# Patient Record
Sex: Female | Born: 1960 | Race: White | Hispanic: No | Marital: Married | Smoking: Never smoker
Health system: Southern US, Community
[De-identification: ages and names within clinical notes are randomized; demographics above are authoritative.]

## PROBLEM LIST (undated history)

## (undated) DIAGNOSIS — E119 Type 2 diabetes mellitus without complications: Secondary | ICD-10-CM

## (undated) DIAGNOSIS — M1990 Unspecified osteoarthritis, unspecified site: Secondary | ICD-10-CM

## (undated) HISTORY — PX: CHOLECYSTECTOMY: SHX55

## (undated) HISTORY — PX: TONSILLECTOMY: SUR1361

## (undated) HISTORY — PX: OTHER SURGICAL HISTORY: SHX169

## (undated) HISTORY — PX: APPENDECTOMY: SHX54

---
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
## 1979-03-03 HISTORY — PX: TONSILLECTOMY: SUR1361

## 2004-04-20 ENCOUNTER — Emergency Department: Payer: Self-pay | Admitting: Unknown Physician Specialty

## 2004-06-05 ENCOUNTER — Ambulatory Visit: Payer: Self-pay

## 2004-06-19 ENCOUNTER — Ambulatory Visit: Payer: Self-pay

## 2005-01-13 ENCOUNTER — Ambulatory Visit: Payer: Self-pay

## 2006-02-04 ENCOUNTER — Ambulatory Visit: Payer: Self-pay

## 2007-02-15 ENCOUNTER — Ambulatory Visit: Payer: Self-pay

## 2008-02-16 ENCOUNTER — Ambulatory Visit: Payer: Self-pay

## 2008-12-20 ENCOUNTER — Ambulatory Visit: Payer: Self-pay | Admitting: Gastroenterology

## 2009-02-18 ENCOUNTER — Ambulatory Visit: Payer: Self-pay

## 2009-03-02 DIAGNOSIS — T8859XA Other complications of anesthesia, initial encounter: Secondary | ICD-10-CM

## 2009-03-02 HISTORY — DX: Other complications of anesthesia, initial encounter: T88.59XA

## 2009-07-31 HISTORY — PX: OTHER SURGICAL HISTORY: SHX169

## 2009-11-30 HISTORY — PX: CHOLECYSTECTOMY: SHX55

## 2009-12-22 ENCOUNTER — Emergency Department: Payer: Self-pay | Admitting: Emergency Medicine

## 2010-01-06 ENCOUNTER — Inpatient Hospital Stay: Payer: Self-pay | Admitting: Surgery

## 2010-02-26 ENCOUNTER — Ambulatory Visit: Payer: Self-pay

## 2011-03-04 ENCOUNTER — Ambulatory Visit: Payer: Self-pay

## 2012-03-07 ENCOUNTER — Ambulatory Visit: Payer: Self-pay

## 2012-06-21 ENCOUNTER — Emergency Department: Payer: Self-pay | Admitting: Emergency Medicine

## 2013-03-08 ENCOUNTER — Ambulatory Visit: Payer: Self-pay

## 2013-06-07 DIAGNOSIS — E119 Type 2 diabetes mellitus without complications: Secondary | ICD-10-CM | POA: Insufficient documentation

## 2013-06-07 DIAGNOSIS — E785 Hyperlipidemia, unspecified: Secondary | ICD-10-CM | POA: Insufficient documentation

## 2014-02-12 ENCOUNTER — Ambulatory Visit: Payer: Self-pay | Admitting: Gastroenterology

## 2014-03-12 ENCOUNTER — Ambulatory Visit: Payer: Self-pay

## 2014-11-03 DIAGNOSIS — R1011 Right upper quadrant pain: Secondary | ICD-10-CM | POA: Insufficient documentation

## 2014-11-03 DIAGNOSIS — Z9884 Bariatric surgery status: Secondary | ICD-10-CM | POA: Insufficient documentation

## 2015-02-19 ENCOUNTER — Other Ambulatory Visit: Payer: Self-pay | Admitting: Obstetrics and Gynecology

## 2015-02-19 DIAGNOSIS — Z1231 Encounter for screening mammogram for malignant neoplasm of breast: Secondary | ICD-10-CM

## 2015-02-28 ENCOUNTER — Emergency Department
Admission: EM | Admit: 2015-02-28 | Discharge: 2015-02-28 | Disposition: A | Discharge status: Home / Self Care | Payer: BC Managed Care – PPO | Attending: Emergency Medicine | Admitting: Emergency Medicine

## 2015-02-28 DIAGNOSIS — Z3202 Encounter for pregnancy test, result negative: Secondary | ICD-10-CM | POA: Diagnosis not present

## 2015-02-28 DIAGNOSIS — E119 Type 2 diabetes mellitus without complications: Secondary | ICD-10-CM | POA: Diagnosis not present

## 2015-02-28 DIAGNOSIS — R1013 Epigastric pain: Secondary | ICD-10-CM | POA: Insufficient documentation

## 2015-02-28 HISTORY — DX: Type 2 diabetes mellitus without complications: E11.9

## 2015-02-28 LAB — COMPREHENSIVE METABOLIC PANEL
ALBUMIN: 4.2 g/dL (ref 3.5–5.0)
ALT: 42 U/L (ref 14–54)
ANION GAP: 7 (ref 5–15)
AST: 26 U/L (ref 15–41)
Alkaline Phosphatase: 132 U/L — ABNORMAL HIGH (ref 38–126)
BUN: 21 mg/dL — ABNORMAL HIGH (ref 6–20)
CHLORIDE: 102 mmol/L (ref 101–111)
CO2: 28 mmol/L (ref 22–32)
Calcium: 9.3 mg/dL (ref 8.9–10.3)
Creatinine, Ser: 0.62 mg/dL (ref 0.44–1.00)
GFR calc non Af Amer: >60 mL/min (ref >60)
GLUCOSE: 178 mg/dL — AB (ref 65–99)
POTASSIUM: 4.3 mmol/L (ref 3.5–5.1)
SODIUM: 137 mmol/L (ref 135–145)
Total Bilirubin: 0.7 mg/dL (ref 0.3–1.2)
Total Protein: 7.5 g/dL (ref 6.5–8.1)

## 2015-02-28 LAB — URINALYSIS COMPLETE WITH MICROSCOPIC (ARMC ONLY)
BACTERIA UA: NONE SEEN
Bilirubin Urine: NEGATIVE
Glucose, UA: NEGATIVE mg/dL
Hgb urine dipstick: NEGATIVE
Leukocytes, UA: NEGATIVE
Nitrite: NEGATIVE
PH: 5 (ref 5.0–8.0)
PROTEIN: NEGATIVE mg/dL
RBC / HPF: NONE SEEN RBC/hpf (ref 0–5)
Specific Gravity, Urine: 1.024 (ref 1.005–1.030)

## 2015-02-28 LAB — CBC
HEMATOCRIT: 43.2 % (ref 35.0–47.0)
HEMOGLOBIN: 14.4 g/dL (ref 12.0–16.0)
MCH: 28.3 pg (ref 26.0–34.0)
MCHC: 33.4 g/dL (ref 32.0–36.0)
MCV: 84.8 fL (ref 80.0–100.0)
Platelets: 253 10*3/uL (ref 150–440)
RBC: 5.1 MIL/uL (ref 3.80–5.20)
RDW: 12.8 % (ref 11.5–14.5)
WBC: 10 10*3/uL (ref 3.6–11.0)

## 2015-02-28 LAB — LIPASE, BLOOD: LIPASE: 48 U/L (ref 11–51)

## 2015-02-28 LAB — POCT PREGNANCY, URINE: PREG TEST UR: NEGATIVE

## 2015-02-28 MED ORDER — DICYCLOMINE HCL 10 MG/ML IM SOLN
20.0000 mg | Freq: Once | INTRAMUSCULAR | Status: AC
  Administered 2015-02-28: 20 mg via INTRAMUSCULAR
  Filled 2015-02-28: qty 2

## 2015-02-28 MED ORDER — GI COCKTAIL ~~LOC~~
30.0000 mL | Freq: Once | ORAL | Status: AC
Start: 2015-02-28 — End: 2015-02-28
  Administered 2015-02-28: 30 mL via ORAL
  Filled 2015-02-28: qty 30

## 2015-02-28 MED ORDER — DICYCLOMINE HCL 20 MG PO TABS: 20.0000 mg | ORAL_TABLET | Freq: Three times a day (TID) | ORAL | Status: AC | PRN

## 2015-02-28 NOTE — Discharge Instructions (Signed)
Please seek medical attention for any high fevers, chest pain, shortness of breath, change in behavior, persistent vomiting, bloody stool or any other new or concerning symptoms. ° ° °Abdominal Pain, Adult °Many things can cause abdominal pain. Usually, abdominal pain is not caused by a disease and will improve without treatment. It can often be observed and treated at home. Your health care provider will do a physical exam and possibly order blood tests and X-rays to help determine the seriousness of your pain. However, in many cases, more time must pass before a clear cause of the pain can be found. Before that point, your health care provider may not know if you need more testing or further treatment. °HOME CARE INSTRUCTIONS °Monitor your abdominal pain for any changes. The following actions may help to alleviate any discomfort you are experiencing: °· Only take over-the-counter or prescription medicines as directed by your health care provider. °· Do not take laxatives unless directed to do so by your health care provider. °· Try a clear liquid diet (broth, tea, or water) as directed by your health care provider. Slowly move to a bland diet as tolerated. °SEEK MEDICAL CARE IF: °· You have unexplained abdominal pain. °· You have abdominal pain associated with nausea or diarrhea. °· You have pain when you urinate or have a bowel movement. °· You experience abdominal pain that wakes you in the night. °· You have abdominal pain that is worsened or improved by eating food. °· You have abdominal pain that is worsened with eating fatty foods. °· You have a fever. °SEEK IMMEDIATE MEDICAL CARE IF: °· Your pain does not go away within 2 hours. °· You keep throwing up (vomiting). °· Your pain is felt only in portions of the abdomen, such as the right side or the left lower portion of the abdomen. °· You pass bloody or black tarry stools. °MAKE SURE YOU: °· Understand these instructions. °· Will watch your  condition. °· Will get help right away if you are not doing well or get worse. °  °This information is not intended to replace advice given to you by your health care provider. Make sure you discuss any questions you have with your health care provider. °  °Document Released: 11/26/2004 Document Revised: 11/07/2014 Document Reviewed: 10/26/2012 °Elsevier Interactive Patient Education ©2016 Elsevier Inc. ° °

## 2015-02-28 NOTE — ED Notes (Signed)
Pt c/o sharp epigastric pain since 10am with nausea.. Denies vomiting or diarrhea

## 2015-02-28 NOTE — ED Provider Notes (Signed)
[facility] Emergency Department Provider Note  ____________________________________________  Time seen: 1710  I have reviewed the triage vital signs and the nursing notes.   HISTORY  Chief Complaint Abdominal Pain   History limited by: Not Limited   HPI Maureen Cruz is a 54 y.o. female who presents to the emergency department today because of concerns for epigastric pain. She states it started suddenly about 8 hours ago. It is sharp. It is severe. It is located in the epigastric region. She has not been able to change the intensity of the pain. She states she has had similar pain a couple of times in the past. She will underwent an EGD recently for this pain which she states showed some esophageal lesions. She denies eating anything unusual recently. No significant recent alcohol or NSAID use. No fevers. No chest pain or shortness breath.   Past Medical History  Diagnosis Date  . Diabetes mellitus without complication (HCC)     There are no active problems to display for this patient.   Past Surgical History  Procedure Laterality Date  . Appendectomy    . Cholecystectomy    . Tonsillectomy    . Cesarean section    . Gastic band      No current outpatient prescriptions on file.  Allergies Metformin and related  No family history on file.  Social History Social History  Substance Use Topics  . Smoking status: Never Smoker   . Smokeless tobacco: None  . Alcohol Use: No    Review of Systems  Constitutional: Negative for fever. Cardiovascular: Negative for chest pain. Respiratory: Negative for shortness of breath. Gastrointestinal: Positive for epigastric abdominal pain. Negative for vomiting or diarrhea. Neurological: Negative for headaches, focal weakness or numbness.  10-point ROS otherwise negative.  ____________________________________________   PHYSICAL EXAM:  VITAL SIGNS: ED Triage Vitals  Enc Vitals Group     BP  02/28/15 1551 125/49 mmHg     Pulse Rate 02/28/15 1551 86     Resp 02/28/15 1551 18     Temp 02/28/15 1551 98.1 F (36.7 C)     Temp Source 02/28/15 1551 Oral     SpO2 02/28/15 1551 98 %     Weight 02/28/15 1551 200 lb (90.719 kg)     Height 02/28/15 1551  (1.575 m)     Head Cir --      Peak Flow --      Pain Score 02/28/15 1551 8   Constitutional: Alert and oriented. Well appearing and in no distress. Eyes: Conjunctivae are normal. PERRL. Normal extraocular movements. ENT   Head: Normocephalic and atraumatic.   Nose: No congestion/rhinnorhea.   Mouth/Throat: Mucous membranes are moist.   Neck: No stridor. Hematological/Lymphatic/Immunilogical: No cervical lymphadenopathy. Cardiovascular: Normal rate, regular rhythm.  No murmurs, rubs, or gallops. Respiratory: Normal respiratory effort without tachypnea nor retractions. Breath sounds are clear and equal bilaterally. No wheezes/rales/rhonchi. Gastrointestinal: Soft and nontender. No distention. There is no CVA tenderness. Genitourinary: Deferred Musculoskeletal: Normal range of motion in all extremities. No joint effusions.  No lower extremity tenderness nor edema. Neurologic:  Normal speech and language. No gross focal neurologic deficits are appreciated.  Skin:  Skin is warm, dry and intact. No rash noted. Psychiatric: Mood and affect are normal. Speech and behavior are normal. Patient exhibits appropriate insight and judgment.  ____________________________________________    LABS (pertinent positives/negatives)  Labs Reviewed  COMPREHENSIVE METABOLIC PANEL - Abnormal; Notable for the following:    Glucose,  Bld 178 (*)    BUN 21 (*)    Alkaline Phosphatase 132 (*)    All other components within normal limits  URINALYSIS COMPLETEWITH MICROSCOPIC ([facility] ONLY) - Abnormal; Notable for the following:    Color, Urine YELLOW (*)    APPearance CLEAR (*)    Ketones, ur TRACE (*)    Squamous Epithelial / LPF 0-5  (*)    All other components within normal limits  LIPASE, BLOOD  CBC  POCT PREGNANCY, URINE     ____________________________________________   EKG  None  ____________________________________________    RADIOLOGY  None   ____________________________________________   PROCEDURES  Procedure(s) performed: None  Critical Care performed: No  ____________________________________________   INITIAL IMPRESSION / ASSESSMENT AND PLAN / ED COURSE  Pertinent labs & imaging results that were available during my care of the patient were reviewed by me and considered in my medical decision making (see chart for details).  Patient presented to the emergency department today because of concerns for epigastric pain. On exam patient's abdomen was benign. She did state she felt better after Bentyl. Blood work without any concerning findings. Patient also recently had EGD done to evaluate for the epigastric pain. This point I think the patient's pain is improved and benign exam she is safe for discharge home.  ____________________________________________   FINAL CLINICAL IMPRESSION(S) / ED DIAGNOSES  Final diagnoses:  Epigastric pain     Phineas SemenGraydon Nickolaus Bordelon, MD 02/28/15 [phone]

## 2015-03-14 ENCOUNTER — Ambulatory Visit
Admission: RE | Admit: 2015-03-14 | Discharge: 2015-03-14 | Disposition: A | Discharge status: Home / Self Care | Payer: BC Managed Care – PPO | Source: Ambulatory Visit | Attending: Obstetrics and Gynecology | Admitting: Obstetrics and Gynecology

## 2015-03-14 DIAGNOSIS — Z1231 Encounter for screening mammogram for malignant neoplasm of breast: Secondary | ICD-10-CM | POA: Diagnosis not present

## 2015-03-21 DIAGNOSIS — K2100 Gastro-esophageal reflux disease with esophagitis, without bleeding: Secondary | ICD-10-CM | POA: Insufficient documentation

## 2015-09-27 DIAGNOSIS — K9509 Other complications of gastric band procedure: Secondary | ICD-10-CM | POA: Insufficient documentation

## 2016-03-24 DIAGNOSIS — N9419 Other specified dyspareunia: Secondary | ICD-10-CM | POA: Insufficient documentation

## 2016-03-27 ENCOUNTER — Other Ambulatory Visit: Payer: Self-pay | Admitting: Obstetrics and Gynecology

## 2016-03-27 DIAGNOSIS — Z1231 Encounter for screening mammogram for malignant neoplasm of breast: Secondary | ICD-10-CM

## 2016-04-28 ENCOUNTER — Ambulatory Visit
Admission: RE | Admit: 2016-04-28 | Discharge: 2016-04-28 | Disposition: A | Discharge status: Home / Self Care | Payer: BC Managed Care – PPO | Source: Ambulatory Visit | Attending: Obstetrics and Gynecology | Admitting: Obstetrics and Gynecology

## 2016-04-28 DIAGNOSIS — Z1231 Encounter for screening mammogram for malignant neoplasm of breast: Secondary | ICD-10-CM | POA: Insufficient documentation

## 2017-03-26 ENCOUNTER — Other Ambulatory Visit: Payer: Self-pay | Admitting: Obstetrics and Gynecology

## 2017-03-26 DIAGNOSIS — Z1231 Encounter for screening mammogram for malignant neoplasm of breast: Secondary | ICD-10-CM

## 2017-04-29 ENCOUNTER — Ambulatory Visit
Admission: RE | Admit: 2017-04-29 | Discharge: 2017-04-29 | Disposition: A | Discharge status: Home / Self Care | Payer: BC Managed Care – PPO | Source: Ambulatory Visit | Attending: Obstetrics and Gynecology | Admitting: Obstetrics and Gynecology

## 2017-04-29 DIAGNOSIS — Z1231 Encounter for screening mammogram for malignant neoplasm of breast: Secondary | ICD-10-CM | POA: Insufficient documentation

## 2018-04-08 ENCOUNTER — Other Ambulatory Visit: Payer: Self-pay | Admitting: Internal Medicine

## 2018-04-08 DIAGNOSIS — Z1231 Encounter for screening mammogram for malignant neoplasm of breast: Secondary | ICD-10-CM

## 2018-05-05 ENCOUNTER — Ambulatory Visit
Admission: RE | Admit: 2018-05-05 | Discharge: 2018-05-05 | Disposition: A | Discharge status: Home / Self Care | Payer: BC Managed Care – PPO | Source: Ambulatory Visit | Attending: Internal Medicine | Admitting: Internal Medicine

## 2018-05-05 DIAGNOSIS — Z1231 Encounter for screening mammogram for malignant neoplasm of breast: Secondary | ICD-10-CM | POA: Insufficient documentation

## 2019-05-01 ENCOUNTER — Other Ambulatory Visit: Payer: Self-pay | Admitting: Infectious Diseases

## 2019-05-01 ENCOUNTER — Other Ambulatory Visit: Payer: Self-pay | Admitting: Internal Medicine

## 2019-05-01 DIAGNOSIS — Z1231 Encounter for screening mammogram for malignant neoplasm of breast: Secondary | ICD-10-CM

## 2019-07-11 ENCOUNTER — Ambulatory Visit
Admission: RE | Admit: 2019-07-11 | Discharge: 2019-07-11 | Disposition: A | Discharge status: Home / Self Care | Payer: BC Managed Care – PPO | Source: Ambulatory Visit | Attending: Infectious Diseases | Admitting: Infectious Diseases

## 2019-07-11 DIAGNOSIS — Z1231 Encounter for screening mammogram for malignant neoplasm of breast: Secondary | ICD-10-CM | POA: Insufficient documentation

## 2020-06-03 ENCOUNTER — Other Ambulatory Visit: Payer: Self-pay | Admitting: General Surgery

## 2020-06-03 ENCOUNTER — Other Ambulatory Visit: Payer: Self-pay | Admitting: Infectious Diseases

## 2020-06-03 DIAGNOSIS — Z1231 Encounter for screening mammogram for malignant neoplasm of breast: Secondary | ICD-10-CM

## 2020-07-11 ENCOUNTER — Ambulatory Visit
Admission: RE | Admit: 2020-07-11 | Discharge: 2020-07-11 | Disposition: A | Discharge status: Home / Self Care | Payer: BC Managed Care – PPO | Source: Ambulatory Visit | Attending: Infectious Diseases | Admitting: Infectious Diseases

## 2020-07-11 ENCOUNTER — Other Ambulatory Visit: Payer: Self-pay

## 2020-07-11 DIAGNOSIS — Z1231 Encounter for screening mammogram for malignant neoplasm of breast: Secondary | ICD-10-CM | POA: Insufficient documentation

## 2020-09-13 DIAGNOSIS — M1990 Unspecified osteoarthritis, unspecified site: Secondary | ICD-10-CM | POA: Insufficient documentation

## 2020-09-13 DIAGNOSIS — H209 Unspecified iridocyclitis: Secondary | ICD-10-CM | POA: Insufficient documentation

## 2021-07-21 ENCOUNTER — Other Ambulatory Visit: Payer: Self-pay | Admitting: Infectious Diseases

## 2021-07-23 ENCOUNTER — Other Ambulatory Visit: Payer: Self-pay | Admitting: Infectious Diseases

## 2021-07-23 DIAGNOSIS — N6310 Unspecified lump in the right breast, unspecified quadrant: Secondary | ICD-10-CM

## 2021-07-27 DIAGNOSIS — M1711 Unilateral primary osteoarthritis, right knee: Secondary | ICD-10-CM | POA: Insufficient documentation

## 2021-08-11 ENCOUNTER — Ambulatory Visit
Admission: RE | Admit: 2021-08-11 | Discharge: 2021-08-11 | Disposition: A | Discharge status: Home / Self Care | Payer: BC Managed Care – PPO | Source: Ambulatory Visit | Attending: Infectious Diseases | Admitting: Infectious Diseases

## 2021-08-11 DIAGNOSIS — N6310 Unspecified lump in the right breast, unspecified quadrant: Secondary | ICD-10-CM

## 2021-09-07 NOTE — Discharge Instructions (Signed)
Instructions after Total Knee Replacement   Maureen Cruz P. Nioma Mccubbins, Jr., M.D.     Dept. of Orthopaedics & Sports Medicine  [facility]  [address]  [place], [place]  [address]  Phone: [phone]   Fax: [phone]    DIET: Drink plenty of non-alcoholic fluids. Resume your normal diet. Include foods high in fiber.  ACTIVITY:  You may use crutches or a walker with weight-bearing as tolerated, unless instructed otherwise. You may be weaned off of the walker or crutches by your Physical Therapist.  Do NOT place pillows under the knee. Anything placed under the knee could limit your ability to straighten the knee.   Continue doing gentle exercises. Exercising will reduce the pain and swelling, increase motion, and prevent muscle weakness.   Please continue to use the TED compression stockings for 6 weeks. You may remove the stockings at night, but should reapply them in the morning. Do not drive or operate any equipment until instructed.  WOUND CARE:  Continue to use the PolarCare or ice packs periodically to reduce pain and swelling. You may bathe or shower after the staples are removed at the first office visit following surgery.  MEDICATIONS: You may resume your regular medications. Please take the pain medication as prescribed on the medication. Do not take pain medication on an empty stomach. You have been given a prescription for a blood thinner (Lovenox or Coumadin). Please take the medication as instructed. (NOTE: After completing a 2 week course of Lovenox, take one Enteric-coated aspirin once a day. This along with elevation will help reduce the possibility of phlebitis in your operated leg.) Do not drive or drink alcoholic beverages when taking pain medications.  CALL THE OFFICE FOR: Temperature above 101 degrees Excessive bleeding or drainage on the dressing. Excessive swelling, coldness, or paleness of the toes. Persistent nausea and vomiting.  FOLLOW-UP:  You  should have an appointment to return to the office in 10-14 days after surgery. Arrangements have been made for continuation of Physical Therapy (either home therapy or outpatient therapy).   [facility] Department Directory         [email or web address]       [email or web address]          Cardiology  Appointments: [place] - [phone] [place] - [phone]  Endocrinology  Appointments: [place] - [phone] [place] - [phone]  Gastroenterology  Appointments: [place] - [phone] [place] - [phone]        General Surgery   Appointments: [place] - [phone]  Internal Medicine/Family Medicine  Appointments: [place] - [phone] [place] - [phone] [place] - [phone]  Metabolic and Weigh Loss Surgery  Appointments: [place] - [phone]        Neurology  Appointments: [place] - [phone] [place] - [phone]  Neurosurgery  Appointments: [place] - [phone]  Obstetrics & Gynecology  Appointments: [place] - [phone] [place] - [phone]        Pediatrics  Appointments: [place] - [phone] [place] - [phone]  Physiatry  Appointments: [place] -[phone]  Physical Therapy  Appointments: [place] - [phone] [place] - [phone]        Podiatry  Appointments: [place] - [phone] [place] - [phone]  Pulmonology  Appointments: [place] - [phone]  Rheumatology  Appointments: [place] - [phone]        [place] Location: [facility]  [address] , [place]  [address]  [place] Location: [facility] [address] [place], [place]  [address]  [place] Location: [facility] [address] [place],   [address]    

## 2021-09-16 ENCOUNTER — Encounter
Admission: RE | Admit: 2021-09-16 | Discharge: 2021-09-16 | Disposition: A | Discharge status: Home / Self Care | Payer: BC Managed Care – PPO | Source: Ambulatory Visit | Attending: Orthopedic Surgery | Admitting: Orthopedic Surgery

## 2021-09-16 VITALS — BP 128/63 | HR 65 | Temp 98.1°F | Resp 16 | Ht 60.0 in | Wt 175.2 lb

## 2021-09-16 DIAGNOSIS — Z01818 Encounter for other preprocedural examination: Secondary | ICD-10-CM | POA: Insufficient documentation

## 2021-09-16 DIAGNOSIS — E119 Type 2 diabetes mellitus without complications: Secondary | ICD-10-CM | POA: Diagnosis not present

## 2021-09-16 DIAGNOSIS — Z01812 Encounter for preprocedural laboratory examination: Secondary | ICD-10-CM

## 2021-09-16 DIAGNOSIS — M1711 Unilateral primary osteoarthritis, right knee: Secondary | ICD-10-CM

## 2021-09-16 HISTORY — DX: Unspecified osteoarthritis, unspecified site: M19.90

## 2021-09-16 LAB — URINALYSIS, ROUTINE W REFLEX MICROSCOPIC
Bacteria, UA: NONE SEEN
Bilirubin Urine: NEGATIVE
Glucose, UA: >=500 mg/dL — AB
Hgb urine dipstick: NEGATIVE
Ketones, ur: NEGATIVE mg/dL
Leukocytes,Ua: NEGATIVE
Nitrite: NEGATIVE
Protein, ur: NEGATIVE mg/dL
Specific Gravity, Urine: 1.024 (ref 1.005–1.030)
WBC, UA: NONE SEEN WBC/hpf (ref 0–5)
pH: 5 (ref 5.0–8.0)

## 2021-09-16 LAB — CBC
HCT: 46.9 % — ABNORMAL HIGH (ref 36.0–46.0)
Hemoglobin: 15.3 g/dL — ABNORMAL HIGH (ref 12.0–15.0)
MCH: 28.5 pg (ref 26.0–34.0)
MCHC: 32.6 g/dL (ref 30.0–36.0)
MCV: 87.5 fL (ref 80.0–100.0)
Platelets: 273 10*3/uL (ref 150–400)
RBC: 5.36 MIL/uL — ABNORMAL HIGH (ref 3.87–5.11)
RDW: 12.5 % (ref 11.5–15.5)
WBC: 8.2 10*3/uL (ref 4.0–10.5)
nRBC: 0 % (ref 0.0–0.2)

## 2021-09-16 LAB — SURGICAL PCR SCREEN
MRSA, PCR: NEGATIVE
Staphylococcus aureus: POSITIVE — AB

## 2021-09-16 LAB — SEDIMENTATION RATE: Sed Rate: 6 mm/hr (ref 0–30)

## 2021-09-16 LAB — C-REACTIVE PROTEIN: CRP: 0.7 mg/dL (ref <1.0)

## 2021-09-16 NOTE — Patient Instructions (Addendum)
Your procedure is scheduled on: Friday September 26, 2021. Report to Day Surgery inside [facility] 2nd floor, stop by admissions desk before getting on elevator.  To find out your arrival time please call [phone] between 1PM - 3PM on Thursday September 25, 2021.  Remember: Instructions that are not followed completely may result in serious medical risk,  up to and including death, or upon the discretion of your surgeon and anesthesiologist your  surgery may need to be rescheduled.     _X__ 1. Do not eat food after midnight the night before your procedure.                 No chewing gum or hard candies. You may drink clear liquids up to 2 hours                 before you are scheduled to arrive for your surgery- DO not drink clear                 liquids within 2 hours of the start of your surgery.                 Clear Liquids include:  water, apple juice without pulp, clear Gatorade, G2 or                  Gatorade Zero (avoid Red/Purple/Blue), Black Coffee or Tea (Do not add                 anything to coffee or tea).  __X__2.   Complete the "Ensure Clear Pre-surgery Clear Carbohydrate Drink" provided to you, 2 hours before arrival. **If you are diabetic you will be provided with an alternative drink, Gatorade Zero or G2.  __X__3.  On the morning of surgery brush your teeth with toothpaste and water, you                may rinse your mouth with mouthwash if you wish.  Do not swallow any toothpaste of mouthwash.     _X__ 4.  No Alcohol for 24 hours before or after surgery.   _X__ 5  Do Not Smoke or use e-cigarettes For 24 Hours Prior to Your Surgery.                 Do not use any chewable tobacco products for at least 6 hours prior to                 Surgery.  _X__  6.  Do not use any recreational drugs (marijuana, cocaine, heroin, ecstasy, MDMA or other)                For at least one week prior to your surgery.  Combination of these drugs with anesthesia                 May have life threatening results.  ____ 7 .  Bring all medications with you on the day of surgery if instructed.   __X_ 8.  Notify your doctor if there is any change in your medical condition      (cold, fever, infections).     Do not wear jewelry, make-up, hairpins, clips or nail polish. Do not wear lotions, powders, or perfumes. You may wear deodorant. Do not shave 48 hours prior to surgery. Men may shave face and neck. Do not bring valuables to the hospital.    [facility] is not responsible for any belongings or valuables.  Contacts, dentures or bridgework may not be worn into surgery. Leave your suitcase in the car. After surgery it may be brought to your room. For patients admitted to the hospital, discharge time is determined by your treatment team.   Patients discharged the day of surgery will not be allowed to drive home.   Make arrangements for someone to be with you for the first 24 hours of your Same Day Discharge.   __X__ Take these medicines the morning of surgery with A SIP OF WATER:    1. None   2.   3.   4.  5.  6.  ____ Fleet Enema (as directed)   __X__ Use CHG Soap (or wipes) as directed  ____ Use Benzoyl Peroxide Gel as instructed  ____ Use inhalers on the day of surgery  __X__ Stop empagliflozin (JARDIANCE) 25 MG 3 days prior to surgery (take last dose Monday 09/22/21)     ____ Take 1/2 of usual insulin dose the night before surgery. No insulin the morning          of surgery.   __X__ One Week prior to surgery- Stop Anti-inflammatories such as Ibuprofen, Aleve, Advil, Motrin, meloxicam (MOBIC), diclofenac, etodolac, ketorolac, Toradol, Daypro, piroxicam, Goody's or BC powders ans aspirin. OK TO USE TYLENOL IF NEEDED   __X__ One week prior to surgery stop ALL supplements until after surgery.    ____ Bring C-Pap to the hospital.    If you have any questions regarding your pre-procedure instructions,  Please call Pre-admit Testing at  [phone]

## 2021-09-21 NOTE — H&P (Signed)
ORTHOPAEDIC HISTORY & PHYSICAL Michelene Gardener, [place] - 09/18/2021 11:00 AM EDT Formatting of this note is different from the original. [facility] Chief Complaint:   Chief Complaint  Patient presents with  Knee Pain  H & P RIGHT KNEE   History of Present Illness:   Maureen Cruz is a 61 y.o. female that presents to clinic today for her preoperative history and evaluation. Patient presents unaccompanied. The patient is scheduled to undergo a right total knee arthroplasty on 09/26/21 by Dr. Ernest Pine. Her pain began 2 years ago. The pain is located primarily along the medial aspect of the knee. She describes her pain as worse with weightbearing, going up and down stairs, and walking. She reports associated swelling with some giving way of the knee. She denies associated numbness or tingling, denies locking.   The patient's symptoms have progressed to the point that they decrease her quality of life. The patient has previously undergone conservative treatment including NSAIDS and injections to the knee without adequate control of her symptoms.  Denies history of lumbar surgery, history of DVT, or significant cardiac history.   Patient is a Type II Diabetic, last A1C was 6.5 on 09/04/21.  Past Medical, Surgical, Family, Social History, Allergies, Medications:   Past Medical History:  Past Medical History:  Diagnosis Date  Acute pancreatitis 09/2009  Allergy  Codeine, Metphormin  Anesthesia complication  had vagal episode  Arthritis  Diabetes mellitus type 2, uncomplicated (CMS-HCC)  Dyspareunia due to medical condition in female patient  Gastroesophageal reflux disease 03/21/2015  GERD (gastroesophageal reflux disease)  History of abnormal cervical Pap smear 1989  History of cataract March/April 2018  Hyperlipidemia  Iridocyclitis  Iritis  Obesity  s/p gastric banding 2011  PONV (postoperative nausea and vomiting)   Past Surgical  History:  Past Surgical History:  Procedure Laterality Date  APPENDECTOMY 1971  TONSILLECTOMY 1980  GYNECOLOGIC CRYOSURGERY 1989  CHOLECYSTECTOMY 2011  pancreatitis with gallbladder removal and additional stone removed  ABDOMINAL SURGERY 2011  LAPAROSCOPIC REMOVAL REPLACEMENT/REVISION GASTRIC BANDING PORT 07/2009  placed in 2011 - has not had removed  BARIATRIC SURGERY June 2011  COLONOSCOPY 02/12/2014  Entire examined colon is normal/Repeat 93yrs for FHx of CC-Mother/PYO  EGD N/A 02/11/2015  Procedure: ESOPHAGOGASTRODUODENOSCOPY, FLEXIBLE, TRANSORAL; DIAGNOSTIC, INCLUDING COLLECTION OF SPECIMEN(S) BY BRUSHING OR WASHING, WHEN PERFORMED (SEPARATE PROCEDURE); Surgeon: Judith Blonder, MD; Location: [facility] OR; Service: Gastroenterology; Laterality: N/A;  COLONOSCOPY 07/06/2019  Normal colon/FHx CC/Repeat 33yrs/[place]  CESAREAN SECTION 1996; 2002  COLONOSCOPY 04/25/02; 12/20/08  COLPOSCOPY 2006, 2011, 2016  TUBAL LIGATION   Current Medications:  Current Outpatient Medications  Medication Sig Dispense Refill  acetaminophen (TYLENOL) 500 MG tablet Take 1,000 mg by mouth every 6 (six) hours as needed  acyclovir (ZOVIRAX) 400 MG tablet Take 400 mg by mouth 3 (three) times daily as needed  aspirin 81 MG EC tablet Take 81 mg by mouth once daily  calcium carbonate (TUMS E-X) 300 mg (750 mg) chewable tablet Take 300 mg of elemental by mouth every 2 (two) hours as needed for Heartburn.  calcium citrate/vitamin D3 (CITRACAL + D ORAL) Take 1 tablet by mouth once daily  cyanocobalamin, vitamin B-12, 2,500 mcg Chew Take 2,500 mcg by mouth once daily  cyclobenzaprine (FLEXERIL) 5 MG tablet Take 5 mg by mouth 2 (two) times daily as needed  empagliflozin (JARDIANCE) 25 mg tablet Take 1 tablet (25 mg total) by mouth once daily 90 tablet 3  glipiZIDE (GLUCOTROL XL) 10  MG XL tablet Take 10 mg by mouth daily with breakfast  ibuprofen (MOTRIN) 200 MG tablet Take 400 mg by mouth every 6 (six) hours  as needed  multivitamin tablet Take 1 tablet by mouth once daily.  naproxen (EC NAPROSYN) 500 MG EC tablet Take 500 mg by mouth 2 (two) times daily with meals  ONETOUCH VERIO TEST STRIPS test strip USE TWO TIMES A DAY 200 strip 2  psyllium (METAMUCIL) 0.52 gram capsule Take 2 capsules by mouth once daily   No current facility-administered medications for this visit.   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  Tobacco Use  Smoking status: Never  Smokeless tobacco: Never  Vaping Use  Vaping Use: Never used  Substance and Sexual Activity  Alcohol use: Yes  Alcohol/week: 0.0 standard drinks  Comment: Very rarely - Once or twice per year  Drug use: Never  Sexual activity: Yes  Partners: Male  Birth control/protection: Surgical, Post-menopausal  Comment: BTL 2002   Family History:  Family History  Problem Relation Age of Onset  Diabetes Father  Lung cancer Father  Arthritis Father  Brain cancer Father  Bladder Exstrophy Father  Cancer Father  bladder  Diabetes type II Father  Obesity Father  High blood pressure (Hypertension) Mother  Colon cancer Mother  Arthritis Mother  Asthma Mother  Diabetes Mother  Rashes / Skin problems Mother  Diabetes type II Mother  Hyperlipidemia (Elevated cholesterol) Mother  Obesity Mother  Osteoarthritis Mother  Skin cancer Mother  No Known Problems Sister  Osteoarthritis Maternal Grandmother   Review of Systems:   A 10+ ROS was performed, reviewed, and the pertinent orthopaedic findings are documented in the HPI.   Physical  Examination:   BP 120/70 (BP Location: Left upper arm, Patient Position: Sitting, BP Cuff Size: Adult)  Ht 152.4 cm (5')  Wt 79.5 kg (175 lb 3.2 oz)  LMP (LMP Unknown) Comment: BTL 2002  BMI 34.22 kg/m   Patient is a well-developed, well-nourished female in no acute distress. Patient has normal mood and affect. Patient is alert and oriented to person, place, and time.   HEENT: Atraumatic, normocephalic. Pupils equal and reactive to light. Extraocular motion intact. Noninjected sclera.  Cardiovascular: Regular rate and rhythm, with no murmurs, rubs, or gallops. Distal pulses palpable.  Respiratory: Lungs clear to auscultation bilaterally.   Right Knee: Soft tissue swelling: moderate Effusion: minimal Erythema: none Crepitance: mild Tenderness: medial Alignment: relative varus Mediolateral laxity: medial pseudolaxity Posterior sag: negative Patellar tracking: Good tracking without evidence of subluxation or tilt Atrophy: No significant atrophy.  Quadriceps tone was fair to good. Range of motion: 0/0/115 degrees   Sensation intact over the saphenous, lateral sural cutaneous, superficial fibular, and deep fibular nerve distributions.  Tests Performed/Reviewed:  X-rays  X-ray knee right 3 views  Result Date: 09/18/2021 3 views of the right knee were obtained. Images reveal severe loss of medial compartment joint space with significant osteophyte formation. No fractures or dislocations. No osseous abnormality noted.   I personally ordered and interpreted today's x-rays.  Impression:   ICD-10-CM  1. Primary osteoarthritis of right knee M17.11  Plan:   The patient has end-stage degenerative changes of the right knee. It was explained to the patient that the condition is progressive in nature. Having failed conservative treatment, the patient has elected to proceed with a total joint arthroplasty. The patient will undergo a total joint arthroplasty with Dr. Ernest Pine. The risks  of surgery, including blood clot and infection, were discussed with the patient. Measures to reduce these risks, including the use of anticoagulation, perioperative antibiotics, and early ambulation were discussed. The importance of postoperative physical therapy was discussed with the patient. The patient elects to proceed with surgery. The patient is instructed to stop all blood thinners prior to surgery. The patient is instructed to call the hospital the day before surgery to learn of the proper arrival time.   Contact our office with any questions or concerns. Follow up as indicated, or sooner should any new problems arise, if conditions worsen, or if they are otherwise concerned.   Michelene Gardener, PA -C [facility] [address] [place], [place] [address] Phone: [phone]  This note was generated in part with voice recognition software and I apologize for any typographical errors that were not detected and corrected.  Electronically signed by Michelene Gardener, PA at 09/18/2021 4:23 PM EDT

## 2021-09-26 ENCOUNTER — Ambulatory Visit: Payer: BC Managed Care – PPO | Admitting: Urgent Care

## 2021-09-26 ENCOUNTER — Encounter
Admission: RE | Disposition: A | Discharge status: Home Health | Payer: Self-pay | Source: Home / Self Care | Attending: Orthopedic Surgery

## 2021-09-26 ENCOUNTER — Observation Stay: Payer: BC Managed Care – PPO

## 2021-09-26 ENCOUNTER — Other Ambulatory Visit: Payer: Self-pay

## 2021-09-26 ENCOUNTER — Observation Stay
Admission: RE | Admit: 2021-09-26 | Discharge: 2021-09-27 | Disposition: A | Discharge status: Home Health | Payer: BC Managed Care – PPO | Attending: Orthopedic Surgery | Admitting: Orthopedic Surgery

## 2021-09-26 ENCOUNTER — Encounter: Payer: Self-pay | Admitting: Orthopedic Surgery

## 2021-09-26 DIAGNOSIS — Z79899 Other long term (current) drug therapy: Secondary | ICD-10-CM | POA: Diagnosis not present

## 2021-09-26 DIAGNOSIS — Z7984 Long term (current) use of oral hypoglycemic drugs: Secondary | ICD-10-CM | POA: Diagnosis not present

## 2021-09-26 DIAGNOSIS — M1711 Unilateral primary osteoarthritis, right knee: Secondary | ICD-10-CM | POA: Diagnosis present

## 2021-09-26 DIAGNOSIS — Z96659 Presence of unspecified artificial knee joint: Secondary | ICD-10-CM

## 2021-09-26 DIAGNOSIS — E119 Type 2 diabetes mellitus without complications: Secondary | ICD-10-CM | POA: Insufficient documentation

## 2021-09-26 DIAGNOSIS — Z6834 Body mass index (BMI) 34.0-34.9, adult: Secondary | ICD-10-CM | POA: Diagnosis not present

## 2021-09-26 DIAGNOSIS — Z7982 Long term (current) use of aspirin: Secondary | ICD-10-CM | POA: Insufficient documentation

## 2021-09-26 DIAGNOSIS — E669 Obesity, unspecified: Secondary | ICD-10-CM | POA: Diagnosis not present

## 2021-09-26 HISTORY — PX: KNEE ARTHROPLASTY: SHX992

## 2021-09-26 LAB — GLUCOSE, CAPILLARY
Glucose-Capillary: 183 mg/dL — ABNORMAL HIGH (ref 70–99)
Glucose-Capillary: 165 mg/dL — ABNORMAL HIGH (ref 70–99)
Glucose-Capillary: 186 mg/dL — ABNORMAL HIGH (ref 70–99)
Glucose-Capillary: 225 mg/dL — ABNORMAL HIGH (ref 70–99)
Glucose-Capillary: 195 mg/dL — ABNORMAL HIGH (ref 70–99)

## 2021-09-26 LAB — ABO/RH: ABO/RH(D): B POS

## 2021-09-26 SURGERY — ARTHROPLASTY, KNEE, TOTAL, USING IMAGELESS COMPUTER-ASSISTED NAVIGATION
Anesthesia: Spinal | Site: Knee | Laterality: Right

## 2021-09-26 MED ORDER — PROPOFOL 500 MG/50ML IV EMUL
INTRAVENOUS | Status: DC | PRN
  Administered 2021-09-26: 125 ug/kg/min via INTRAVENOUS

## 2021-09-26 MED ORDER — DICYCLOMINE HCL 20 MG PO TABS: 20.0000 mg | ORAL_TABLET | Freq: Three times a day (TID) | ORAL | Status: DC | PRN

## 2021-09-26 MED ORDER — CELECOXIB 200 MG PO CAPS: 400.0000 mg | ORAL_CAPSULE | Freq: Once | ORAL | Status: AC

## 2021-09-26 MED ORDER — FENTANYL CITRATE (PF) 250 MCG/5ML IJ SOLN
INTRAMUSCULAR | Status: AC
  Filled 2021-09-26: qty 5

## 2021-09-26 MED ORDER — FENTANYL CITRATE (PF) 100 MCG/2ML IJ SOLN: 25.0000 ug | INTRAMUSCULAR | Status: DC | PRN

## 2021-09-26 MED ORDER — ACETAMINOPHEN 325 MG PO TABS: 325.0000 mg | ORAL_TABLET | Freq: Four times a day (QID) | ORAL | Status: DC | PRN

## 2021-09-26 MED ORDER — PROPOFOL 1000 MG/100ML IV EMUL
INTRAVENOUS | Status: AC
Start: 2021-09-26 — End: ?
  Filled 2021-09-26: qty 100

## 2021-09-26 MED ORDER — ADULT MULTIVITAMIN W/MINERALS CH
1.0000 | ORAL_TABLET | Freq: Every day | ORAL | Status: DC
  Administered 2021-09-26 – 2021-09-27 (×2): 1 via ORAL
  Filled 2021-09-26 (×2): qty 1

## 2021-09-26 MED ORDER — GLYCOPYRROLATE 0.2 MG/ML IJ SOLN
INTRAMUSCULAR | Status: DC | PRN
  Administered 2021-09-26: .2 mg via INTRAVENOUS

## 2021-09-26 MED ORDER — TRANEXAMIC ACID-NACL 1000-0.7 MG/100ML-% IV SOLN
1000.0000 mg | INTRAVENOUS | Status: AC
  Administered 2021-09-26: 1000 mg via INTRAVENOUS

## 2021-09-26 MED ORDER — CEFAZOLIN SODIUM-DEXTROSE 2-4 GM/100ML-% IV SOLN
2.0000 g | Freq: Four times a day (QID) | INTRAVENOUS | Status: AC
  Administered 2021-09-26 (×2): 2 g via INTRAVENOUS
  Filled 2021-09-26 (×4): qty 100

## 2021-09-26 MED ORDER — INSULIN ASPART 100 UNIT/ML IJ SOLN
0.0000 [IU] | Freq: Three times a day (TID) | INTRAMUSCULAR | Status: DC
  Administered 2021-09-26: 5 [IU] via SUBCUTANEOUS
  Administered 2021-09-27: 2 [IU] via SUBCUTANEOUS
  Administered 2021-09-27: 5 [IU] via SUBCUTANEOUS
  Filled 2021-09-26 (×3): qty 1

## 2021-09-26 MED ORDER — OXYCODONE HCL 5 MG PO TABS
10.0000 mg | ORAL_TABLET | ORAL | Status: DC | PRN
  Administered 2021-09-26 – 2021-09-27 (×3): 10 mg via ORAL
  Filled 2021-09-26 (×3): qty 2

## 2021-09-26 MED ORDER — EPHEDRINE 5 MG/ML INJ
INTRAVENOUS | Status: AC
  Filled 2021-09-26: qty 5

## 2021-09-26 MED ORDER — FERROUS SULFATE 325 (65 FE) MG PO TABS
325.0000 mg | ORAL_TABLET | Freq: Two times a day (BID) | ORAL | Status: DC
  Administered 2021-09-26 – 2021-09-27 (×2): 325 mg via ORAL
  Filled 2021-09-26 (×2): qty 1

## 2021-09-26 MED ORDER — PHENYLEPHRINE HCL-NACL 20-0.9 MG/250ML-% IV SOLN
INTRAVENOUS | Status: DC | PRN
  Administered 2021-09-26: 20 ug/min via INTRAVENOUS

## 2021-09-26 MED ORDER — GABAPENTIN 300 MG PO CAPS: 300.0000 mg | ORAL_CAPSULE | Freq: Once | ORAL | Status: AC

## 2021-09-26 MED ORDER — ONDANSETRON HCL 4 MG/2ML IJ SOLN
4.0000 mg | Freq: Four times a day (QID) | INTRAMUSCULAR | Status: DC | PRN
  Administered 2021-09-26 – 2021-09-27 (×3): 4 mg via INTRAVENOUS
  Filled 2021-09-26 (×3): qty 2

## 2021-09-26 MED ORDER — SODIUM CHLORIDE 0.9 % IV SOLN: INTRAVENOUS | Status: DC

## 2021-09-26 MED ORDER — ENOXAPARIN SODIUM 30 MG/0.3ML IJ SOSY
30.0000 mg | PREFILLED_SYRINGE | Freq: Two times a day (BID) | INTRAMUSCULAR | Status: DC
  Administered 2021-09-27: 30 mg via SUBCUTANEOUS
  Filled 2021-09-26: qty 0.3

## 2021-09-26 MED ORDER — TRANEXAMIC ACID-NACL 1000-0.7 MG/100ML-% IV SOLN
INTRAVENOUS | Status: AC
  Administered 2021-09-26: 1000 mg via INTRAVENOUS
  Filled 2021-09-26: qty 100

## 2021-09-26 MED ORDER — SODIUM CHLORIDE 0.9 % IR SOLN
Status: DC | PRN
  Administered 2021-09-26: 3000 mL

## 2021-09-26 MED ORDER — MIDAZOLAM HCL 2 MG/2ML IJ SOLN
INTRAMUSCULAR | Status: AC
  Filled 2021-09-26: qty 2

## 2021-09-26 MED ORDER — MAGNESIUM HYDROXIDE 400 MG/5ML PO SUSP
30.0000 mL | Freq: Every day | ORAL | Status: DC
  Administered 2021-09-26 – 2021-09-27 (×2): 30 mL via ORAL
  Filled 2021-09-26 (×2): qty 30

## 2021-09-26 MED ORDER — TRANEXAMIC ACID-NACL 1000-0.7 MG/100ML-% IV SOLN: 1000.0000 mg | Freq: Once | INTRAVENOUS | Status: AC

## 2021-09-26 MED ORDER — ORAL CARE MOUTH RINSE: 15.0000 mL | Freq: Once | OROMUCOSAL | Status: AC

## 2021-09-26 MED ORDER — DEXAMETHASONE SODIUM PHOSPHATE 10 MG/ML IJ SOLN
INTRAMUSCULAR | Status: AC
  Administered 2021-09-26: 8 mg via INTRAVENOUS
  Filled 2021-09-26: qty 1

## 2021-09-26 MED ORDER — GLYCOPYRROLATE 0.2 MG/ML IJ SOLN
INTRAMUSCULAR | Status: AC
  Filled 2021-09-26: qty 1

## 2021-09-26 MED ORDER — BISACODYL 10 MG RE SUPP: 10.0000 mg | Freq: Every day | RECTAL | Status: DC | PRN

## 2021-09-26 MED ORDER — PROPOFOL 1000 MG/100ML IV EMUL
INTRAVENOUS | Status: AC
  Filled 2021-09-26: qty 100

## 2021-09-26 MED ORDER — OXYCODONE HCL 5 MG PO TABS: 5.0000 mg | ORAL_TABLET | ORAL | Status: DC | PRN

## 2021-09-26 MED ORDER — PHENYLEPHRINE HCL-NACL 20-0.9 MG/250ML-% IV SOLN
INTRAVENOUS | Status: AC
  Filled 2021-09-26: qty 250

## 2021-09-26 MED ORDER — HYDROMORPHONE HCL 1 MG/ML IJ SOLN: 0.5000 mg | INTRAMUSCULAR | Status: DC | PRN

## 2021-09-26 MED ORDER — 0.9 % SODIUM CHLORIDE (POUR BTL) OPTIME
TOPICAL | Status: DC | PRN
  Administered 2021-09-26: 500 mL

## 2021-09-26 MED ORDER — ONDANSETRON HCL 4 MG/2ML IJ SOLN
INTRAMUSCULAR | Status: DC | PRN
  Administered 2021-09-26: 4 mg via INTRAVENOUS

## 2021-09-26 MED ORDER — CHLORHEXIDINE GLUCONATE 0.12 % MT SOLN: 15.0000 mL | Freq: Once | OROMUCOSAL | Status: AC

## 2021-09-26 MED ORDER — BUPIVACAINE HCL (PF) 0.5 % IJ SOLN
INTRAMUSCULAR | Status: DC | PRN
  Administered 2021-09-26: 2.6 mL

## 2021-09-26 MED ORDER — PHENOL 1.4 % MT LIQD: 1.0000 | OROMUCOSAL | Status: DC | PRN

## 2021-09-26 MED ORDER — MIDAZOLAM HCL 5 MG/5ML IJ SOLN
INTRAMUSCULAR | Status: DC | PRN
  Administered 2021-09-26 (×2): 1 mg via INTRAVENOUS

## 2021-09-26 MED ORDER — GABAPENTIN 300 MG PO CAPS
ORAL_CAPSULE | ORAL | Status: AC
  Administered 2021-09-26: 300 mg via ORAL
  Filled 2021-09-26: qty 1

## 2021-09-26 MED ORDER — ACETAMINOPHEN 10 MG/ML IV SOLN
INTRAVENOUS | Status: AC
Start: 2021-09-26 — End: ?
  Filled 2021-09-26: qty 100

## 2021-09-26 MED ORDER — INSULIN ASPART 100 UNIT/ML IJ SOLN: 0.0000 [IU] | Freq: Every day | INTRAMUSCULAR | Status: DC

## 2021-09-26 MED ORDER — PANTOPRAZOLE SODIUM 40 MG PO TBEC
40.0000 mg | DELAYED_RELEASE_TABLET | Freq: Two times a day (BID) | ORAL | Status: DC
  Administered 2021-09-26 – 2021-09-27 (×2): 40 mg via ORAL
  Filled 2021-09-26 (×3): qty 1

## 2021-09-26 MED ORDER — DEXAMETHASONE SODIUM PHOSPHATE 10 MG/ML IJ SOLN: 8.0000 mg | Freq: Once | INTRAMUSCULAR | Status: AC

## 2021-09-26 MED ORDER — ONDANSETRON HCL 4 MG PO TABS: 4.0000 mg | ORAL_TABLET | Freq: Four times a day (QID) | ORAL | Status: DC | PRN

## 2021-09-26 MED ORDER — ALUM & MAG HYDROXIDE-SIMETH 200-200-20 MG/5ML PO SUSP: 30.0000 mL | ORAL | Status: DC | PRN

## 2021-09-26 MED ORDER — CELECOXIB 200 MG PO CAPS
200.0000 mg | ORAL_CAPSULE | Freq: Two times a day (BID) | ORAL | Status: DC
  Administered 2021-09-27: 200 mg via ORAL
  Filled 2021-09-26: qty 1

## 2021-09-26 MED ORDER — ACYCLOVIR 200 MG PO CAPS
400.0000 mg | ORAL_CAPSULE | Freq: Three times a day (TID) | ORAL | Status: DC
  Administered 2021-09-26: 400 mg via ORAL
  Filled 2021-09-26 (×3): qty 2

## 2021-09-26 MED ORDER — ONDANSETRON HCL 4 MG/2ML IJ SOLN
INTRAMUSCULAR | Status: AC
  Filled 2021-09-26: qty 2

## 2021-09-26 MED ORDER — SENNOSIDES-DOCUSATE SODIUM 8.6-50 MG PO TABS
1.0000 | ORAL_TABLET | Freq: Two times a day (BID) | ORAL | Status: DC
  Administered 2021-09-26 – 2021-09-27 (×3): 1 via ORAL
  Filled 2021-09-26 (×3): qty 1

## 2021-09-26 MED ORDER — ACETAMINOPHEN 10 MG/ML IV SOLN
1000.0000 mg | Freq: Four times a day (QID) | INTRAVENOUS | Status: AC
  Administered 2021-09-26 – 2021-09-27 (×4): 1000 mg via INTRAVENOUS
  Filled 2021-09-26 (×4): qty 100

## 2021-09-26 MED ORDER — METOCLOPRAMIDE HCL 5 MG PO TABS
10.0000 mg | ORAL_TABLET | Freq: Three times a day (TID) | ORAL | Status: DC
  Administered 2021-09-26 – 2021-09-27 (×4): 10 mg via ORAL
  Filled 2021-09-26 (×4): qty 2

## 2021-09-26 MED ORDER — VITAMIN B-12 1000 MCG PO TABS
2500.0000 ug | ORAL_TABLET | Freq: Every day | ORAL | Status: DC
  Administered 2021-09-27: 2500 ug via ORAL
  Filled 2021-09-26: qty 3

## 2021-09-26 MED ORDER — GLIPIZIDE ER 10 MG PO TB24
10.0000 mg | ORAL_TABLET | Freq: Every day | ORAL | Status: DC
  Administered 2021-09-27: 10 mg via ORAL
  Filled 2021-09-26: qty 1

## 2021-09-26 MED ORDER — FLEET ENEMA 7-19 GM/118ML RE ENEM: 1.0000 | ENEMA | Freq: Once | RECTAL | Status: DC | PRN

## 2021-09-26 MED ORDER — CEFAZOLIN SODIUM-DEXTROSE 2-4 GM/100ML-% IV SOLN
2.0000 g | INTRAVENOUS | Status: AC
  Administered 2021-09-26: 2 g via INTRAVENOUS

## 2021-09-26 MED ORDER — TRAMADOL HCL 50 MG PO TABS: 50.0000 mg | ORAL_TABLET | ORAL | Status: DC | PRN

## 2021-09-26 MED ORDER — EPHEDRINE SULFATE (PRESSORS) 50 MG/ML IJ SOLN
INTRAMUSCULAR | Status: DC | PRN
  Administered 2021-09-26 (×3): 5 mg via INTRAVENOUS

## 2021-09-26 MED ORDER — MENTHOL 3 MG MT LOZG: 1.0000 | LOZENGE | OROMUCOSAL | Status: DC | PRN

## 2021-09-26 MED ORDER — FAMOTIDINE 20 MG PO TABS
ORAL_TABLET | ORAL | Status: AC
  Administered 2021-09-26: 20 mg via ORAL
  Filled 2021-09-26: qty 1

## 2021-09-26 MED ORDER — CELECOXIB 200 MG PO CAPS
ORAL_CAPSULE | ORAL | Status: AC
  Administered 2021-09-26: 400 mg via ORAL
  Filled 2021-09-26: qty 2

## 2021-09-26 MED ORDER — FAMOTIDINE 20 MG PO TABS: 20.0000 mg | ORAL_TABLET | Freq: Once | ORAL | Status: AC

## 2021-09-26 MED ORDER — ACETAMINOPHEN 10 MG/ML IV SOLN
INTRAVENOUS | Status: DC | PRN
  Administered 2021-09-26: 1000 mg via INTRAVENOUS

## 2021-09-26 MED ORDER — CYCLOBENZAPRINE HCL 10 MG PO TABS: 5.0000 mg | ORAL_TABLET | ORAL | Status: DC | PRN

## 2021-09-26 MED ORDER — SODIUM CHLORIDE (PF) 0.9 % IJ SOLN
INTRAMUSCULAR | Status: DC | PRN
  Administered 2021-09-26: 120 mL via INTRAMUSCULAR

## 2021-09-26 MED ORDER — SURGIPHOR WOUND IRRIGATION SYSTEM - OPTIME
TOPICAL | Status: DC | PRN
  Administered 2021-09-26: 1

## 2021-09-26 MED ORDER — CALCIUM CARBONATE ANTACID 500 MG PO CHEW
500.0000 mg | CHEWABLE_TABLET | Freq: Every day | ORAL | Status: DC
  Administered 2021-09-27: 500 mg via ORAL
  Filled 2021-09-26: qty 3

## 2021-09-26 MED ORDER — CHLORHEXIDINE GLUCONATE 0.12 % MT SOLN
OROMUCOSAL | Status: AC
  Administered 2021-09-26: 15 mL via OROMUCOSAL
  Filled 2021-09-26: qty 15

## 2021-09-26 MED ORDER — DIPHENHYDRAMINE HCL 12.5 MG/5ML PO ELIX: 12.5000 mg | ORAL_SOLUTION | ORAL | Status: DC | PRN

## 2021-09-26 MED ORDER — PSYLLIUM 95 % PO PACK
1.0000 | PACK | Freq: Every day | ORAL | Status: DC
  Administered 2021-09-26 – 2021-09-27 (×2): 1 via ORAL
  Filled 2021-09-26 (×2): qty 1

## 2021-09-26 MED ORDER — OXYCODONE HCL 5 MG PO TABS: 5.0000 mg | ORAL_TABLET | Freq: Once | ORAL | Status: DC | PRN

## 2021-09-26 MED ORDER — OXYCODONE HCL 5 MG/5ML PO SOLN: 5.0000 mg | Freq: Once | ORAL | Status: DC | PRN

## 2021-09-26 SURGICAL SUPPLY — 66 items
ATTUNE MED DOME PAT 32 KNEE (Knees) ×1 IMPLANT
ATTUNE PS FEM RT SZ 3 CEM KNEE (Femur) ×1 IMPLANT
ATTUNE PSRP INSR SZ3 12 KNEE (Insert) ×1 IMPLANT
BASE TIBIAL ROT PLAT SZ 3 KNEE (Knees) IMPLANT
BATTERY INSTRU NAVIGATION (MISCELLANEOUS) ×8 IMPLANT
BLADE SAW 70X12.5 (BLADE) ×2 IMPLANT
BLADE SAW 90X13X1.19 OSCILLAT (BLADE) ×2 IMPLANT
BLADE SAW 90X25X1.19 OSCILLAT (BLADE) ×2 IMPLANT
BONE CEMENT GENTAMICIN (Cement) ×4 IMPLANT
CEMENT BONE GENTAMICIN 40 (Cement) IMPLANT
COOLER POLAR GLACIER W/PUMP (MISCELLANEOUS) ×2 IMPLANT
DRAPE 3/4 80X56 (DRAPES) ×2 IMPLANT
DRSG DERMACEA NONADH 3X8 (GAUZE/BANDAGES/DRESSINGS) ×1 IMPLANT
DRSG MEPILEX SACRM 8.7X9.8 (GAUZE/BANDAGES/DRESSINGS) ×2 IMPLANT
DRSG OPSITE POSTOP 4X14 (GAUZE/BANDAGES/DRESSINGS) ×1 IMPLANT
DRSG TEGADERM 4X4.75 (GAUZE/BANDAGES/DRESSINGS) ×1 IMPLANT
DURAPREP 26ML APPLICATOR (WOUND CARE) ×4 IMPLANT
ELECT CAUTERY BLADE 6.4 (BLADE) ×2 IMPLANT
ELECT REM PT RETURN 9FT ADLT (ELECTROSURGICAL) ×2
ELECTRODE REM PT RTRN 9FT ADLT (ELECTROSURGICAL) ×1 IMPLANT
EX-PIN ORTHOLOCK NAV 4X150 (PIN) ×3 IMPLANT
GLOVE BIO SURGEON STRL SZ7 (GLOVE) ×4 IMPLANT
GLOVE BIOGEL M STRL SZ7.5 (GLOVE) ×4 IMPLANT
GLOVE BIOGEL PI IND STRL 8 (GLOVE) ×1 IMPLANT
GLOVE BIOGEL PI INDICATOR 8 (GLOVE) ×1
GLOVE SURG UNDER POLY LF SZ7.5 (GLOVE) ×3 IMPLANT
GOWN STRL REUS W/ TWL LRG LVL3 (GOWN DISPOSABLE) ×2 IMPLANT
GOWN STRL REUS W/ TWL XL LVL3 (GOWN DISPOSABLE) ×1 IMPLANT
GOWN STRL REUS W/TWL LRG LVL3 (GOWN DISPOSABLE) ×2
GOWN STRL REUS W/TWL XL LVL3 (GOWN DISPOSABLE) ×1
HEMOVAC 400CC 10FR (MISCELLANEOUS) ×2 IMPLANT
HOLDER FOLEY CATH W/STRAP (MISCELLANEOUS) ×2 IMPLANT
HOLSTER ELECTROSUGICAL PENCIL (MISCELLANEOUS) ×1 IMPLANT
HOOD PEEL AWAY FLYTE STAYCOOL (MISCELLANEOUS) ×4 IMPLANT
IV NS IRRIG 3000ML ARTHROMATIC (IV SOLUTION) ×2 IMPLANT
KIT TURNOVER KIT A (KITS) ×2 IMPLANT
KNIFE SCULPS 14X20 (INSTRUMENTS) ×2 IMPLANT
MANIFOLD NEPTUNE II (INSTRUMENTS) ×4 IMPLANT
NDL SPNL 20GX3.5 QUINCKE YW (NEEDLE) ×2 IMPLANT
NEEDLE SPNL 20GX3.5 QUINCKE YW (NEEDLE) ×4 IMPLANT
NS IRRIG 500ML POUR BTL (IV SOLUTION) ×2 IMPLANT
PACK TOTAL KNEE (MISCELLANEOUS) ×2 IMPLANT
PAD ABD DERMACEA PRESS 5X9 (GAUZE/BANDAGES/DRESSINGS) ×2 IMPLANT
PAD WRAPON POLAR KNEE (MISCELLANEOUS) ×1 IMPLANT
PIN DRILL FIX HALF THREAD (BIT) ×4 IMPLANT
PIN FIXATION 1/8DIA X 3INL (PIN) ×2 IMPLANT
PULSAVAC PLUS IRRIG FAN TIP (DISPOSABLE) ×2
SOL PREP PVP 2OZ (MISCELLANEOUS) ×2
SOLUTION IRRIG SURGIPHOR (IV SOLUTION) ×2 IMPLANT
SOLUTION PREP PVP 2OZ (MISCELLANEOUS) ×1 IMPLANT
SPONGE DRAIN TRACH 4X4 STRL 2S (GAUZE/BANDAGES/DRESSINGS) ×1 IMPLANT
STAPLER SKIN PROX 35W (STAPLE) ×2 IMPLANT
STOCKINETTE IMPERV 14X48 (MISCELLANEOUS) IMPLANT
STRAP TIBIA SHORT (MISCELLANEOUS) ×2 IMPLANT
SUCTION FRAZIER HANDLE 10FR (MISCELLANEOUS) ×1
SUCTION TUBE FRAZIER 10FR DISP (MISCELLANEOUS) ×1 IMPLANT
SUT VIC AB 0 CT1 36 (SUTURE) ×4 IMPLANT
SUT VIC AB 1 CT1 36 (SUTURE) ×4 IMPLANT
SUT VIC AB 2-0 CT2 27 (SUTURE) ×2 IMPLANT
SYR 30ML LL (SYRINGE) ×4 IMPLANT
TIBIAL BASE ROT PLAT SZ 3 KNEE (Knees) ×2 IMPLANT
TIP FAN IRRIG PULSAVAC PLUS (DISPOSABLE) ×1 IMPLANT
TOWER CARTRIDGE SMART MIX (DISPOSABLE) ×2 IMPLANT
TRAY FOLEY MTR SLVR 16FR STAT (SET/KITS/TRAYS/PACK) ×2 IMPLANT
WATER STERILE IRR 1000ML POUR (IV SOLUTION) ×1 IMPLANT
WRAPON POLAR PAD KNEE (MISCELLANEOUS) ×2

## 2021-09-26 NOTE — Anesthesia Preprocedure Evaluation (Signed)
Anesthesia Evaluation  Patient identified by MRN, date of birth, ID band Patient awake    Reviewed: Allergy & Precautions, NPO status , Patient's Chart, lab work & pertinent test results  History of Anesthesia Complications (+) history of anesthetic complications  Airway Mallampati: II  TM Distance: >3 FB Neck ROM: full    Dental  (+) Poor Dentition   Pulmonary neg pulmonary ROS, neg shortness of breath, neg sleep apnea, neg recent URI,    Pulmonary exam normal        Cardiovascular (-) angina(-) CAD, (-) Past MI and (-) CABG negative cardio ROS Normal cardiovascular exam     Neuro/Psych negative neurological ROS  negative psych ROS   GI/Hepatic negative GI ROS, Neg liver ROS,   Endo/Other  negative endocrine ROSdiabetes  Renal/GU      Musculoskeletal   Abdominal   Peds  Hematology negative hematology ROS (+)   Anesthesia Other Findings Patient with history of anesthetic complications. Per the patient, during her gastric band surgery, she went bradycardic and chest compressions were briefly started. Patient has had other general anesthesia events since with no problems.   Past Medical History: No date: Arthritis 2011: Complication of anesthesia     Comment:  BP dropped, had chest compressions and Vagal response No date: Diabetes mellitus without complication [facility])  Past Surgical History: 1971: APPENDECTOMY     Comment:  as ac child No date: CESAREAN SECTION     Comment:  1996 and 2002 11/2009: CHOLECYSTECTOMY 07/2009: gastic band     Comment:  put in 2011 and removed in 2017 at [facility] 1981: TONSILLECTOMY  BMI    Body Mass Index: 34.23 kg/m      Reproductive/Obstetrics negative OB ROS                             Anesthesia Physical Anesthesia Plan  ASA: 2  Anesthesia Plan: General/Spinal   Post-op Pain Management:    Induction: Intravenous  PONV Risk Score and Plan: 3  and Ondansetron, Dexamethasone and Propofol infusion  Airway Management Planned: Nasal Cannula  Additional Equipment:   Intra-op Plan:   Post-operative Plan: Extubation in OR  Informed Consent: I have reviewed the patients History and Physical, chart, labs and discussed the procedure including the risks, benefits and alternatives for the proposed anesthesia with the patient or authorized representative who has indicated his/her understanding and acceptance.     Dental Advisory Given  Plan Discussed with: Anesthesiologist, CRNA and Surgeon  Anesthesia Plan Comments: (Patient consented for risks of anesthesia including but not limited to:  - adverse reactions to medications - damage to eyes, teeth, lips or other oral mucosa - nerve damage due to positioning  - sore throat or hoarseness - Damage to heart, brain, nerves, lungs, other parts of body or loss of life  Patient voiced understanding.)        Anesthesia Quick Evaluation

## 2021-09-26 NOTE — H&P (Signed)
The patient has been re-examined, and the chart reviewed, and there have been no interval changes to the documented history and physical.    The risks, benefits, and alternatives have been discussed at length. The patient expressed understanding of the risks benefits and agreed with plans for surgical intervention.  Juvia Aerts P. Franceen Erisman, Jr. M.D.    

## 2021-09-26 NOTE — Plan of Care (Signed)
  Problem: Education: Goal: Ability to describe self-care measures that may prevent or decrease complications (Diabetes Survival Skills Education) will improve Outcome: Progressing   Problem: Coping: Goal: Ability to adjust to condition or change in health will improve Outcome: Progressing   Problem: Fluid Volume: Goal: Ability to maintain a balanced intake and output will improve Outcome: Progressing   Problem: Health Behavior/Discharge Planning: Goal: Ability to identify and utilize available resources and services will improve Outcome: Progressing   Problem: Health Behavior/Discharge Planning: Goal: Ability to manage health-related needs will improve Outcome: Progressing   Problem: Metabolic: Goal: Ability to maintain appropriate glucose levels will improve Outcome: Progressing   Problem: Nutritional: Goal: Maintenance of adequate nutrition will improve Outcome: Progressing   Problem: Nutritional: Goal: Progress toward achieving an optimal weight will improve Outcome: Progressing   Problem: Skin Integrity: Goal: Risk for impaired skin integrity will decrease Outcome: Progressing   Problem: Tissue Perfusion: Goal: Adequacy of tissue perfusion will improve Outcome: Progressing   Problem: Education: Goal: Knowledge of General Education information will improve Description: Including pain rating scale, medication(s)/side effects and non-pharmacologic comfort measures Outcome: Progressing   Problem: Health Behavior/Discharge Planning: Goal: Ability to manage health-related needs will improve Outcome: Progressing   Problem: Clinical Measurements: Goal: Ability to maintain clinical measurements within normal limits will improve Outcome: Progressing   Problem: Clinical Measurements: Goal: Will remain free from infection Outcome: Progressing   Problem: Clinical Measurements: Goal: Diagnostic test results will improve Outcome: Progressing   Problem: Clinical  Measurements: Goal: Respiratory complications will improve Outcome: Progressing   Problem: Clinical Measurements: Goal: Cardiovascular complication will be avoided Outcome: Progressing   Problem: Activity: Goal: Risk for activity intolerance will decrease Outcome: Progressing   Problem: Nutrition: Goal: Adequate nutrition will be maintained Outcome: Progressing   Problem: Coping: Goal: Level of anxiety will decrease Outcome: Progressing   Problem: Elimination: Goal: Will not experience complications related to bowel motility Outcome: Progressing   Problem: Elimination: Goal: Will not experience complications related to urinary retention Outcome: Progressing   Problem: Pain Managment: Goal: General experience of comfort will improve Outcome: Progressing   Problem: Safety: Goal: Ability to remain free from injury will improve Outcome: Progressing   Problem: Skin Integrity: Goal: Risk for impaired skin integrity will decrease Outcome: Progressing   Problem: Activity: Goal: Range of joint motion will improve Outcome: Progressing   Problem: Clinical Measurements: Goal: Postoperative complications will be avoided or minimized Outcome: Progressing   Problem: Pain Management: Goal: Pain level will decrease with appropriate interventions Outcome: Progressing   Problem: Skin Integrity: Goal: Will show signs of wound healing Outcome: Progressing

## 2021-09-26 NOTE — Op Note (Signed)
OPERATIVE NOTE  DATE OF SURGERY:  09/26/2021  PATIENT NAME:  Maureen Cruz   DOB: 01-Jul-1960  MRN: [id number]  PRE-OPERATIVE DIAGNOSIS: Degenerative arthrosis of the right knee, primary  POST-OPERATIVE DIAGNOSIS:  Same  PROCEDURE:  Right total knee arthroplasty using computer-assisted navigation  SURGEON:  Jena Gauss. M.D.  ANESTHESIA: spinal  ESTIMATED BLOOD LOSS: 50 mL  FLUIDS REPLACED: 1100 mL of crystalloid  TOURNIQUET TIME: 87 minutes  DRAINS: 2 medium Hemovac drains  SOFT TISSUE RELEASES: Anterior cruciate ligament, posterior cruciate ligament, deep and superficial medial collateral ligament, patellofemoral ligament   IMPLANTS UTILIZED: DePuy Attune size 3 posterior stabilized femoral component (cemented), size 3 rotating platform tibial component (cemented), 32 mm medialized dome patella (cemented), and a 12 mm stabilized rotating platform polyethylene insert.  INDICATIONS FOR SURGERY: Maureen Cruz is a 61 y.o. year old female with a long history of progressive knee pain. X-rays demonstrated severe degenerative changes in tricompartmental fashion. The patient had not seen any significant improvement despite conservative nonsurgical intervention. After discussion of the risks and benefits of surgical intervention, the patient expressed understanding of the risks benefits and agree with plans for total knee arthroplasty.   The risks, benefits, and alternatives were discussed at length including but not limited to the risks of infection, bleeding, nerve injury, stiffness, blood clots, the need for revision surgery, cardiopulmonary complications, among others, and they were willing to proceed.  PROCEDURE IN DETAIL: The patient was brought into the operating room and, after adequate spinal anesthesia was achieved, a tourniquet was placed on the patient's upper thigh. The patient's knee and leg were cleaned and prepped with alcohol and DuraPrep and draped in the usual sterile  fashion. A "timeout" was performed as per usual protocol. The lower extremity was exsanguinated using an Esmarch, and the tourniquet was inflated to 300 mmHg. An anterior longitudinal incision was made followed by a standard mid vastus approach. The deep fibers of the medial collateral ligament were elevated in a subperiosteal fashion off of the medial flare of the tibia so as to maintain a continuous soft tissue sleeve. The patella was subluxed laterally and the patellofemoral ligament was incised. Inspection of the knee demonstrated severe degenerative changes with full-thickness loss of articular cartilage. Osteophytes were debrided using a rongeur. Anterior and posterior cruciate ligaments were excised. Two 4.0 mm Schanz pins were inserted in the femur and into the tibia for attachment of the array of trackers used for computer-assisted navigation. Hip center was identified using a circumduction technique. Distal landmarks were mapped using the computer. The distal femur and proximal tibia were mapped using the computer. The distal femoral cutting guide was positioned using computer-assisted navigation so as to achieve a 5 distal valgus cut. The femur was sized and it was felt that a size 3 femoral component was appropriate. A size 3 femoral cutting guide was positioned and the anterior cut was performed and verified using the computer. This was followed by completion of the posterior and chamfer cuts. Femoral cutting guide for the central box was then positioned in the center box cut was performed.  Attention was then directed to the proximal tibia. Medial and lateral menisci were excised. The extramedullary tibial cutting guide was positioned using computer-assisted navigation so as to achieve a 0 varus-valgus alignment and 3 posterior slope. The cut was performed and verified using the computer. The proximal tibia was sized and it was felt that a size 3 tibial tray was appropriate. Tibial and femoral  trials  were inserted followed by insertion of a 12 mm polyethylene insert. The knee was felt to be tight medially. A Cobb elevator was used to elevate the superficial fibers of the medial collateral ligament.  This allowed for excellent mediolateral soft tissue balancing both in flexion and in full extension. Finally, the patella was cut and prepared so as to accommodate a 32 mm medialized dome patella. A patella trial was placed and the knee was placed through a range of motion with excellent patellar tracking appreciated. The femoral trial was removed after debridement of posterior osteophytes. The central post-hole for the tibial component was reamed followed by insertion of a keel punch. Tibial trials were then removed. Cut surfaces of bone were irrigated with copious amounts of normal saline using pulsatile lavage and then suctioned dry. Polymethylmethacrylate cement with gentamicin was prepared in the usual fashion using a vacuum mixer. Cement was applied to the cut surface of the proximal tibia as well as along the undersurface of a size 3 rotating platform tibial component. Tibial component was positioned and impacted into place. Excess cement was removed using Personal assistant. Cement was then applied to the cut surfaces of the femur as well as along the posterior flanges of the size 3 femoral component. The femoral component was positioned and impacted into place. Excess cement was removed using Personal assistant. A 12 mm polyethylene trial was inserted and the knee was brought into full extension with steady axial compression applied. Finally, cement was applied to the backside of a 32 mm medialized dome patella and the patellar component was positioned and patellar clamp applied. Excess cement was removed using Personal assistant. After adequate curing of the cement, the tourniquet was deflated after a total tourniquet time of 87 minutes. Hemostasis was achieved using electrocautery. The knee was irrigated  with copious amounts of normal saline using pulsatile lavage followed by 450 ml of Surgiphor and then suctioned dry. 20 mL of 1.3% Exparel and 60 mL of 0.25% Marcaine in 40 mL of normal saline was injected along the posterior capsule, medial and lateral gutters, and along the arthrotomy site. A 12 mm stabilized rotating platform polyethylene insert was inserted and the knee was placed through a range of motion with excellent mediolateral soft tissue balancing appreciated and excellent patellar tracking noted. 2 medium drains were placed in the wound bed and brought out through separate stab incisions. The medial parapatellar portion of the incision was reapproximated using interrupted sutures of #1 Vicryl. Subcutaneous tissue was approximated in layers using first #0 Vicryl followed #2-0 Vicryl. The skin was approximated with skin staples. A sterile dressing was applied.  The patient tolerated the procedure well and was transported to the recovery room in stable condition.    Nimco Bivens P. Angie Fava., M.D.

## 2021-09-26 NOTE — Evaluation (Signed)
Physical Therapy Evaluation Patient Details Name: Maureen Cruz MRN: [id number] DOB: November 12, 1960 Today's Date: 09/26/2021  History of Present Illness  Pt is 61 y.o. 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  Clinical Impression  Pt received in Semi-Fowler's position and agreeable to therapy.  Pt noted to be nauseous upon arrival and was actively getting medication from nursing staff upon arrival.  Pt participated with therapy in bed level exercises and has good strength.  Mobility will be completed in AM and stair training to follow.  Anticipate pt being able to perform those tasks with good technique and be able to d/c home with HHPT.         Recommendations for follow up therapy are one component of a multi-disciplinary discharge planning process, led by the attending physician.  Recommendations may be updated based on patient status, additional functional criteria and insurance authorization.  Follow Up Recommendations Home health PT      Assistance Recommended at Discharge PRN  Patient can return home with the following  A little help with walking and/or transfers;A little help with bathing/dressing/bathroom;Help with stairs or ramp for entrance    Equipment Recommendations None recommended by PT  Recommendations for Other Services       Functional Status Assessment Patient has had a recent decline in their functional status and demonstrates the ability to make significant improvements in function in a reasonable and predictable amount of time.     Precautions / Restrictions Restrictions Weight Bearing Restrictions: Yes RLE Weight Bearing: Weight bearing as tolerated      Mobility  Bed Mobility               General bed mobility comments: deferred due to feeling nauseous.    Transfers                        Ambulation/Gait                  Stairs            Wheelchair Mobility    Modified Rankin (Stroke Patients Only)       Balance                                              Pertinent Vitals/Pain Pain Assessment Pain Assessment: 0-10 Pain Score: 6  Pain Location: R Knee Pain Descriptors / Indicators: Aching Pain Intervention(s): Limited activity within patient's tolerance, Monitored during session, Premedicated before session    Home Living Family/patient expects to be discharged to:: Private residence Living Arrangements: Spouse/significant other;Children Available Help at Discharge: Family;Available 24 hours/day Type of Home: House Home Access: Stairs to enter Entrance Stairs-Rails: Left Entrance Stairs-Number of Steps: 4-5 Alternate Level Stairs-Number of Steps: 15 Home Layout: Two level;Able to live on main level with bedroom/bathroom Home Equipment: Rolling Walker (2 wheels);Cane - single point;BSC/3in1;Shower seat - built in;Grab bars - toilet;Grab bars - tub/shower;Hand held shower head;Wheelchair - Engineer, technical sales - power      Prior Function Prior Level of Function : Independent/Modified Independent                     Hand Dominance   Dominant Hand: Right    Extremity/Trunk Assessment   Upper Extremity Assessment Upper Extremity Assessment: Overall WFL for tasks assessed    Lower Extremity Assessment Lower Extremity Assessment: RLE  deficits/detail RLE Deficits / Details: weakness s/p R TKA       Communication   Communication: No difficulties  Cognition Arousal/Alertness: Awake/alert Behavior During Therapy: WFL for tasks assessed/performed Overall Cognitive Status: Within Functional Limits for tasks assessed                                          General Comments General comments (skin integrity, edema, etc.): unable to assess at thsi time.    Exercises Total Joint Exercises Ankle Circles/Pumps: AROM, Strengthening, Both, 10 reps, Supine Quad Sets: AROM, Strengthening, Both, 10 reps, Supine Gluteal Sets: AROM, Strengthening, Both, 10 reps,  Supine Heel Slides: AROM, Strengthening, Both, 10 reps, Supine Hip ABduction/ADduction: AROM, Strengthening, Both, 10 reps, Supine Straight Leg Raises: AROM, Strengthening, Both, 10 reps, Supine Other Exercises Other Exercises: pt educated on purpose of PT and benefits of therapy/exercises for improved outcomes s/p surgical intervention.   Assessment/Plan    PT Assessment Patient needs continued PT services  PT Problem List Decreased strength;Decreased range of motion;Decreased activity tolerance;Decreased balance;Decreased mobility;Decreased coordination;Decreased knowledge of use of DME;Decreased safety awareness;Decreased knowledge of precautions;Pain       PT Treatment Interventions DME instruction;Gait training;Stair training;Functional mobility training;Therapeutic activities;Therapeutic exercise;Balance training;Neuromuscular re-education    PT Goals (Current goals can be found in the Care Plan section)  Acute Rehab PT Goals Patient Stated Goal: to get stronger and go home. PT Goal Formulation: With patient/family Time For Goal Achievement: 10/10/21 Potential to Achieve Goals: Good    Frequency BID     Co-evaluation               AM-PAC PT "6 Clicks" Mobility  Outcome Measure Help needed turning from your back to your side while in a flat bed without using bedrails?: A Little Help needed moving from lying on your back to sitting on the side of a flat bed without using bedrails?: A Little Help needed moving to and from a bed to a chair (including a wheelchair)?: A Little Help needed standing up from a chair using your arms (e.g., wheelchair or bedside chair)?: A Little Help needed to walk in hospital room?: A Lot Help needed climbing 3-5 steps with a railing? : A Lot 6 Click Score: 16    End of Session   Activity Tolerance: Patient tolerated treatment well;Treatment limited secondary to medical complications (Comment) Patient left: in bed;with call bell/phone  within reach;with bed alarm set;with nursing/sitter in room;with family/visitor present Nurse Communication: Mobility status PT Visit Diagnosis: Unsteadiness on feet (R26.81);Other abnormalities of gait and mobility (R26.89);Muscle weakness (generalized) (M62.81);Difficulty in walking, not elsewhere classified (R26.2);Pain Pain - Right/Left: Right Pain - part of body: Knee    Time: 8786-7672 PT Time Calculation (min) (ACUTE ONLY): 20 min   Charges:   PT Evaluation $PT Eval Low Complexity: 1 Low PT Treatments $Therapeutic Exercise: 8-22 mins        Nolon Bussing, PT, DPT 09/26/21, 6:28 PM

## 2021-09-26 NOTE — Transfer of Care (Signed)
Immediate Anesthesia Transfer of Care Note  Patient: Maureen Cruz  Procedure(s) Performed: COMPUTER ASSISTED TOTAL KNEE ARTHROPLASTY - RNFA (Right: Knee)  Patient Location: PACU  Anesthesia Type:Spinal  Level of Consciousness: awake, drowsy and patient cooperative  Airway & Oxygen Therapy: Patient Spontanous Breathing and Patient connected to nasal cannula oxygen  Post-op Assessment: Report given to RN and Post -op Vital signs reviewed and stable  Post vital signs: Reviewed and stable  Last Vitals:  Vitals Value Taken Time  BP    Temp    Pulse    Resp    SpO2      Last Pain:  Vitals:   09/26/21 0841  TempSrc: Temporal  PainSc: 3          Complications: No notable events documented.

## 2021-09-26 NOTE — Anesthesia Postprocedure Evaluation (Signed)
Anesthesia Post Note  Patient: Maureen Cruz  Procedure(s) Performed: COMPUTER ASSISTED TOTAL KNEE ARTHROPLASTY - RNFA (Right: Knee)  Patient location during evaluation: PACU Anesthesia Type: Spinal Level of consciousness: awake and alert Pain management: pain level controlled Vital Signs Assessment: post-procedure vital signs reviewed and stable Respiratory status: spontaneous breathing, nonlabored ventilation, respiratory function stable and patient connected to nasal cannula oxygen Cardiovascular status: blood pressure returned to baseline and stable Postop Assessment: no apparent nausea or vomiting Anesthetic complications: no   No notable events documented.   Last Vitals:  Vitals:   09/26/21 1355 09/26/21 1415  BP:  (!) 104/56  Pulse: 97 89  Resp: (!) 9 16  Temp:  36.5 C  SpO2: 97% 98%    Last Pain:  Vitals:   09/26/21 1415  TempSrc:   PainSc: 0-No pain                 Stephanie Coup

## 2021-09-26 NOTE — Anesthesia Procedure Notes (Signed)
Spinal  Patient location during procedure: OR Start time: 09/26/2021 10:07 AM End time: 09/26/2021 10:11 AM Reason for block: surgical anesthesia Staffing Performed: resident/CRNA  Anesthesiologist: Dimas Millin, MD Resident/CRNA: Jerrye Noble, CRNA Performed by: Jerrye Noble, CRNA Authorized by: Dimas Millin, MD   Preanesthetic Checklist Completed: patient identified, IV checked, site marked, risks and benefits discussed, surgical consent, monitors and equipment checked, pre-op evaluation and timeout performed Spinal Block Patient position: sitting Prep: ChloraPrep Patient monitoring: heart rate, continuous pulse ox, blood pressure and cardiac monitor Approach: midline Location: L3-4 Injection technique: single-shot Needle Needle type: Introducer and Pencan  Needle gauge: 24 G Needle length: 9 cm Assessment Sensory level: T10 Events: CSF return Additional Notes Sterile aseptic technique used throughout the procedure.  Negative paresthesia. Negative blood return. Positive free-flowing CSF. Expiration date of kit checked and confirmed. Patient tolerated procedure well, without complications.

## 2021-09-27 DIAGNOSIS — M1711 Unilateral primary osteoarthritis, right knee: Secondary | ICD-10-CM | POA: Diagnosis not present

## 2021-09-27 LAB — GLUCOSE, CAPILLARY
Glucose-Capillary: 131 mg/dL — ABNORMAL HIGH (ref 70–99)
Glucose-Capillary: 217 mg/dL — ABNORMAL HIGH (ref 70–99)

## 2021-09-27 MED ORDER — CELECOXIB 200 MG PO CAPS: 200.0000 mg | ORAL_CAPSULE | Freq: Two times a day (BID) | ORAL | 0 refills | Status: AC

## 2021-09-27 MED ORDER — ENOXAPARIN SODIUM 40 MG/0.4ML IJ SOSY: 40.0000 mg | PREFILLED_SYRINGE | INTRAMUSCULAR | 0 refills | Status: AC

## 2021-09-27 MED ORDER — TRAMADOL HCL 50 MG PO TABS: 50.0000 mg | ORAL_TABLET | ORAL | 0 refills | Status: AC | PRN

## 2021-09-27 MED ORDER — ONDANSETRON HCL 4 MG PO TABS: 4.0000 mg | ORAL_TABLET | Freq: Four times a day (QID) | ORAL | 0 refills | Status: AC | PRN

## 2021-09-27 MED ORDER — OXYCODONE HCL 5 MG PO TABS: 5.0000 mg | ORAL_TABLET | ORAL | 0 refills | Status: AC | PRN

## 2021-09-27 NOTE — Plan of Care (Signed)
Patient discharged per MD orders at this time.All discharge instructions,education and medications reviewed with the patient.Pt expressed understanding and will comply with dc instructions.follow up appointments was also communicated to the patient.no verbal c/o or any ssx of distress.Pt was discharged home with HH/PT services per order.Pt was transported home by spouse in a privately owned vehicle. 

## 2021-09-27 NOTE — Progress Notes (Signed)
  Subjective: 1 Day Post-Op Procedure(s) (LRB): COMPUTER ASSISTED TOTAL KNEE ARTHROPLASTY - RNFA (Right) Patient reports pain as well-controlled.   Patient is well, and has had no acute complaints or problems Plan is to go Home after hospital stay. Negative for chest pain and shortness of breath Fever: no Gastrointestinal: negative for nausea and vomiting.    Objective: Vital signs in last 24 hours: Temp:  [97.7 F (36.5 C)-98.8 F (37.1 C)] 97.7 F (36.5 C) (07/29 0719) Pulse Rate:  [52-106] 52 (07/29 0719) Resp:  [9-16] 16 (07/29 0719) BP: (102-118)/(51-63) 106/51 (07/29 0719) SpO2:  [94 %-98 %] 95 % (07/29 0719)  Intake/Output from previous day:  Intake/Output Summary (Last 24 hours) at 09/27/2021 0955 Last data filed at 09/27/2021 0600 Gross per 24 hour  Intake 1780 ml  Output 1665 ml  Net 115 ml    Intake/Output this shift: No intake/output data recorded.  Labs: No results for input(s): "HGB" in the last 72 hours. No results for input(s): "WBC", "RBC", "HCT", "PLT" in the last 72 hours. No results for input(s): "NA", "K", "CL", "CO2", "BUN", "CREATININE", "GLUCOSE", "CALCIUM" in the last 72 hours. No results for input(s): "LABPT", "INR" in the last 72 hours.   EXAM General - Patient is Alert, Appropriate, and Oriented Extremity - Neurovascular intact Dorsiflexion/Plantar flexion intact Compartment soft Dressing/Incision -Postoperative dressing remains in place., Polar Care in place and working. , Hemovac in place. , Following removal of post-op dressing, minimal drainage noted  Motor Function - intact, moving foot and toes well on exam.  Cardiovascular- Regular rate and rhythm, no murmurs/rubs/gallops Respiratory- Lungs clear to auscultation bilaterally    Assessment/Plan: 1 Day Post-Op Procedure(s) (LRB): COMPUTER ASSISTED TOTAL KNEE ARTHROPLASTY - RNFA (Right) Principal Problem:   Total knee replacement status  Estimated body mass index is 34.23 kg/m as  calculated from the following:   Height as of this encounter: 5' (1.524 m).   Weight as of this encounter: 79.5 kg. Advance diet Up with therapy  Anticipate d/c today pending completion of therapy goals.   Post-op dressing removed. , Hemovac removed., and Mini compression dressing applied.   DVT Prophylaxis - Lovenox, Ted hose, and SCDs Weight-Bearing as tolerated to right leg  Baldwin Jamaica, PA-C [facility] Orthopaedic Surgery 09/27/2021, 9:55 AM

## 2021-09-27 NOTE — TOC Transition Note (Signed)
Transition of Care [facility]) - CM/SW Discharge Note   Patient Details  Name: Maureen Cruz MRN: [id number] Date of Birth: [birth year]  Transition of Care [facility]) CM/SW Contact:  Luvenia Redden, RN Phone Number:[phone] 09/27/2021, 11:09 AM   Clinical Narrative:    Spoke with pt concerning recommendation for HHPT and DME. Pt states she has all her DME for RW and lives in a single family home with a good support system. Pt verifies she has sufficient transportation and the ability to afford all her prescribed medications with no delays. Pt states she has been called by [facility] for services already. RN verified and contacted the agency with an update on pt's discharged disposition for HHPT. No other requested as pt again has all her DME with no additional needs presentd at this time.Marland Kitchen  TOC will continue to be available for any additional request or needs.     Final next level of care: Home w Home Health Services Barriers to Discharge: Barriers Resolved   Patient Goals and CMS Choice        Discharge Placement                    Patient and family notified of of transfer: 09/27/21 (spoke directly wit the pt with family at bedside)  Discharge Plan and Services                          HH Arranged: PT [facility] Agency: [facility] Date [facility] Agency Contacted: 09/27/21 Time HH Agency Contacted: [phone] Representative spoke with at [facility] Agency: Laurelyn Sickle  Social Determinants of Health (SDOH) Interventions     Readmission Risk Interventions     No data to display

## 2021-09-27 NOTE — Evaluation (Signed)
Occupational Therapy Evaluation Patient Details Name: Maureen Cruz MRN: [id number] DOB: 14-Jul-1960 Today's Date: 09/27/2021   History of Present Illness Pt is 61 y.o. 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   Clinical Impression   Pt seen this date for OT evaluation s/p right TKA on 08/31/2021.  Pt lives with her spouse and daughter in a 2 story home but is able to stay on 1st level at this time.  She has a built in shower seat, toilet riser, walker and BSC.  She presents with muscle weakness, decreased transfers, functional mobility and decreased ability to perform self care and IADL tasks.  She currently requires min assist for lower body bathing and dressing.  Ambulates to the bathroom for toileting with supervision to min guard with walker. She would benefit from skilled OT services to maximize her safety and independence and reduce caregiver burden.       Recommendations for follow up therapy are one component of a multi-disciplinary discharge planning process, led by the attending physician.  Recommendations may be updated based on patient status, additional functional criteria and insurance authorization.   Follow Up Recommendations  No OT follow up    Assistance Recommended at Discharge Intermittent Supervision/Assistance  Patient can return home with the following A little help with walking and/or transfers;A little help with bathing/dressing/bathroom;Assistance with cooking/housework    Functional Status Assessment  Patient has had a recent decline in their functional status and demonstrates the ability to make significant improvements in function in a reasonable and predictable amount of time.  Equipment Recommendations       Recommendations for Other Services       Precautions / Restrictions Restrictions Weight Bearing Restrictions: Yes RLE Weight Bearing: Weight bearing as tolerated      Mobility Bed Mobility Overal bed mobility: Needs Assistance Bed Mobility: Supine to Sit, Sit  to Supine     Supine to sit: Modified independent (Device/Increase time) Sit to supine: Modified independent (Device/Increase time)        Transfers Overall transfer level: Needs assistance   Transfers: Sit to/from Stand, Bed to chair/wheelchair/BSC Sit to Stand: Min guard, Supervision                  Balance Overall balance assessment: Mild deficits observed, not formally tested                                         ADL either performed or assessed with clinical judgement   ADL Overall ADL's : Needs assistance/impaired Eating/Feeding: Independent   Grooming: Wash/dry hands;Wash/dry face;Oral care;Standing;Supervision/safety   Upper Body Bathing: Modified independent   Lower Body Bathing: Minimal assistance   Upper Body Dressing : Modified independent   Lower Body Dressing: Minimal assistance Lower Body Dressing Details (indicate cue type and reason): Difficulty reaching to socks to don and doff, normally performs in long sitting on the bed. Toilet Transfer: Haematologist and Hygiene: Supervision/safety       Functional mobility during ADLs: Min guard       Vision Baseline Vision/History: 1 Wears glasses       Perception     Praxis      Pertinent Vitals/Pain Pain Assessment Pain Assessment: 0-10 Pain Score: 3  Pain Location: R Knee Pain Descriptors / Indicators: Aching     Hand Dominance Right   Extremity/Trunk Assessment Upper Extremity Assessment Upper  Extremity Assessment: Generalized weakness   Lower Extremity Assessment Lower Extremity Assessment: Defer to PT evaluation RLE Deficits / Details: weakness s/p R TKA       Communication Communication Communication: No difficulties   Cognition Arousal/Alertness: Awake/alert Behavior During Therapy: WFL for tasks assessed/performed Overall Cognitive Status: Within Functional Limits for tasks assessed                                        General Comments   Pt seen for toileting with min guard to supervision, elevated toilet seat utilized.  Grooming at the [place] with supervision.  Unable to reach to don or doff socks.  Discussed use of adaptive equipment such as a sock aid, she doesn't feel she will need it.  She had a reacher but it broke, recommend she replace it to use to help with lower body dressing and to obtain items out of reach.      Exercises     Shoulder Instructions      Home Living Family/patient expects to be discharged to:: Private residence Living Arrangements: Spouse/significant other;Children Available Help at Discharge: Family;Available 24 hours/day Type of Home: House Home Access: Stairs to enter [employer] of Steps: 4-5 Entrance Stairs-Rails: Left Home Layout: Two level;Able to live on main level with bedroom/bathroom Alternate Level Stairs-Number of Steps: 15 Alternate Level Stairs-Rails: Left Bathroom Shower/Tub: Tub/shower unit;Walk-in shower;Door   Bathroom Toilet: Handicapped height     Home Equipment: Conservation officer, nature (2 wheels);Cane - single point;BSC/3in1;Shower seat - built in;Grab bars - toilet;Grab bars - tub/shower;Hand held shower head;Wheelchair - Education officer, community - power          Prior Functioning/Environment Prior Level of Function : Independent/Modified Independent                        OT Problem List: Decreased strength;Decreased knowledge of use of DME or AE;Decreased activity tolerance;Impaired balance (sitting and/or standing);Pain      OT Treatment/Interventions: Self-care/ADL training;Therapeutic exercise;Patient/family education;Balance training;Therapeutic activities;DME and/or AE instruction    OT Goals(Current goals can be found in the care plan section) Acute Rehab OT Goals Patient Stated Goal: Pt reports she would like to return home with her family and be as independent as possible OT Goal Formulation: With  patient Time For Goal Achievement: 10/11/21 Potential to Achieve Goals: Good ADL Goals Pt Will Perform Lower Body Bathing: with modified independence Pt Will Perform Lower Body Dressing: with modified independence  OT Frequency: Min 2X/week    Co-evaluation              AM-PAC OT "6 Clicks" Daily Activity     Outcome Measure Help from another person eating meals?: None Help from another person taking care of personal grooming?: None Help from another person toileting, which includes using toliet, bedpan, or urinal?: None Help from another person bathing (including washing, rinsing, drying)?: A Little Help from another person to put on and taking off regular upper body clothing?: None Help from another person to put on and taking off regular lower body clothing?: A Little 6 Click Score: 22   End of Session Equipment Utilized During Treatment: Gait belt;Rolling walker (2 wheels)  Activity Tolerance: Patient tolerated treatment well Patient left: in bed;with call bell/phone within reach  OT Visit Diagnosis: Muscle weakness (generalized) (M62.81);Pain Pain - Right/Left: Right Pain - part of body: Knee  Time: 2952-8413 OT Time Calculation (min): 32 min Charges:  OT General Charges $OT Visit: 1 Visit OT Evaluation $OT Eval Low Complexity: 1 Low OT Treatments $Self Care/Home Management : 8-22 mins Dian Laprade T Sheriece Jefcoat, OTR/L, CLT Moe Brier 09/27/2021, 9:50 AM

## 2021-09-27 NOTE — Discharge Summary (Cosign Needed Addendum)
Physician Discharge Summary  Patient ID: Maureen Cruz MRN: [id number] DOB/AGE: 1960-10-15 61 y.o.  Admit date: 09/26/2021 Discharge date: 09/27/2021  Admission Diagnoses:  Total knee replacement status [Z96.659]  Surgeries:Procedure(s): Right total knee arthroplasty using computer-assisted navigation   SURGEON:  Jena Gauss. M.D.   ANESTHESIA: spinal   ESTIMATED BLOOD LOSS: 50 mL   FLUIDS REPLACED: 1100 mL of crystalloid   TOURNIQUET TIME: 87 minutes   DRAINS: 2 medium Hemovac drains   SOFT TISSUE RELEASES: Anterior cruciate ligament, posterior cruciate ligament, deep and superficial medial collateral ligament, patellofemoral ligament    IMPLANTS UTILIZED: DePuy Attune size 3 posterior stabilized femoral component (cemented), size 3 rotating platform tibial component (cemented), 32 mm medialized dome patella (cemented), and a 12 mm stabilized rotating platform polyethylene insert.  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   Dyspareunia due to medical condition in female patient 03/24/2016   Complication of gastric banding 09/27/2015   Gastroesophageal reflux disease with esophagitis 03/21/2015   H/O laparoscopic adjustable gastric banding 11/03/2014   Right upper quadrant abdominal pain 11/03/2014   DM (diabetes mellitus), type 2 (HCC) 06/07/2013   Hyperlipidemia 06/07/2013    Past Medical History:  Diagnosis Date   Arthritis    Complication of anesthesia 2011   BP dropped, had chest compressions and Vagal response   Diabetes mellitus without complication (HCC)      Transfusion:    Consultants (if any):   Discharged Condition: Improved  Hospital Course: Maureen Cruz is an 61 y.o. female who was admitted 09/26/2021 with a diagnosis of right knee osteoarthritis  and went to the operating room on 09/26/2021 and underwent right total knee arthroplasty. The patient received perioperative antibiotics for prophylaxis (see below). The patient tolerated the procedure well and was transported to PACU in stable condition. After meeting PACU criteria, the patient was subsequently transferred to the Orthopaedics/Rehabilitation unit.   The patient received DVT prophylaxis in the form of early mobilization, Lovenox, TED hose, and SCDs . A sacral pad had been placed and heels were elevated off of the bed with rolled towels in order to protect skin integrity. Foley catheter was discontinued on postoperative day #0. Wound drains were discontinued on postoperative day #1. The surgical incision was healing well without signs of infection.  Physical therapy was initiated postoperatively for transfers, gait training, and strengthening. Occupational therapy was initiated for activities of daily living and evaluation for assisted devices. Rehabilitation goals were reviewed in detail with the patient. The patient made steady progress with physical therapy and physical therapy recommended discharge to Home.   The patient achieved the preliminary goals of this hospitalization and was felt to be medically and orthopaedically appropriate for discharge.  She was given perioperative antibiotics:  Anti-infectives (From admission, onward)    Start     Dose/Rate Route Frequency Ordered Stop   09/26/21 1600  acyclovir (ZOVIRAX) 200 MG capsule 400 mg        400 mg Oral 3 times daily 09/26/21 1449     09/26/21 1600  ceFAZolin (ANCEF) IVPB 2g/100 mL premix        2 g 200 mL/hr over 30 Minutes Intravenous Every 6 hours 09/26/21 1337 09/26/21 2207   09/26/21 0600  ceFAZolin (ANCEF) IVPB 2g/100 mL premix  2 g 200 mL/hr over 30 Minutes Intravenous On call to O.R. 09/26/21 0254 09/26/21 1044     .  Recent vital signs:  Vitals:   09/27/21 0500 09/27/21 0719  BP: (!) 116/58 (!) 106/51   Pulse: 79 (!) 52  Resp: 15 16  Temp: 98 F (36.7 C) 97.7 F (36.5 C)  SpO2: 97% 95%    Recent laboratory studies:  No results for input(s): "WBC", "HGB", "HCT", "PLT", "K", "CL", "CO2", "BUN", "CREATININE", "GLUCOSE", "CALCIUM", "LABPT", "INR" in the last 72 hours.  Diagnostic Studies: DG Knee Right Port  Result Date: 09/26/2021 CLINICAL DATA:  Post knee replacement in a 61 year old female. EXAM: PORTABLE RIGHT KNEE - 1-2 VIEW COMPARISON:  None available FINDINGS: Expected postoperative appearance of the RIGHT knee following total knee arthroplasty. Surgical drains in the soft tissues. Gas in fluid in the joint and local gas in the soft tissues. Skin staples project over the knee. Ghost tracts in the tibia and femur perhaps from prior external fixation or and or prior trauma. IMPRESSION: Expected postoperative appearance following RIGHT total knee arthroplasty. Ghost tracks within the tibia and distal femur perhaps related to prior external fixation hardware placement. Electronically Signed   By: Donzetta Kohut M.D.   On: 09/26/2021 13:59    Discharge Medications:   Allergies as of 09/27/2021       Reactions   Codeine Nausea And Vomiting   Metformin And Related Diarrhea        Medication List     STOP taking these medications    aspirin EC 81 MG tablet   ibuprofen 200 MG tablet Commonly known as: ADVIL   naproxen 500 MG EC tablet Commonly known as: EC NAPROSYN       TAKE these medications    acetaminophen 500 MG tablet Commonly known as: TYLENOL Take 500 mg by mouth every 6 (six) hours as needed.   acyclovir 400 MG tablet Commonly known as: ZOVIRAX Take 400 mg by mouth 3 (three) times daily.   calcium carbonate 750 MG chewable tablet Commonly known as: TUMS EX Chew 1 tablet by mouth daily.   CALCIUM CITRATE-VITAMIN D3 PO Take by mouth.   celecoxib 200 MG capsule Commonly known as: CELEBREX Take 1 capsule (200 mg total) by mouth 2 (two) times daily.    Cyanocobalamin 2500 MCG Chew Chew by mouth.   cyclobenzaprine 5 MG tablet Commonly known as: FLEXERIL Take 5 mg by mouth as needed for muscle spasms.   dicyclomine 20 MG tablet Commonly known as: Bentyl Take 1 tablet (20 mg total) by mouth 3 (three) times daily as needed (abdominal pain).   empagliflozin 25 MG Tabs tablet Commonly known as: JARDIANCE Take by mouth daily.   enoxaparin 40 MG/0.4ML injection Commonly known as: LOVENOX Inject 0.4 mLs (40 mg total) into the skin daily for 14 days.   glipiZIDE 10 MG 24 hr tablet Commonly known as: GLUCOTROL XL Take 10 mg by mouth daily with breakfast.   melatonin 5 MG Tabs Take 5 mg by mouth.   MULTIVITAMIN ADULT PO Take by mouth.   ondansetron 4 MG tablet Commonly known as: ZOFRAN Take 1 tablet (4 mg total) by mouth every 6 (six) hours as needed for nausea.   oxyCODONE 5 MG immediate release tablet Commonly known as: Oxy IR/ROXICODONE Take 1 tablet (5 mg total) by mouth every 4 (four) hours as needed for severe pain.   prednisoLONE acetate 1 % ophthalmic suspension Commonly known as: PRED FORTE 1 drop 4 (  four) times daily.   psyllium 0.52 g capsule Commonly known as: REGULOID Take 0.52 g by mouth daily.   traMADol 50 MG tablet Commonly known as: ULTRAM Take 1 tablet (50 mg total) by mouth every 4 (four) hours as needed for moderate pain.               Durable Medical Equipment  (From admission, onward)           Start     Ordered   09/26/21 1450  DME Walker rolling  Once       Question:  Patient needs a walker to treat with the following condition  Answer:  Total knee replacement status   09/26/21 1449   09/26/21 1450  DME Bedside commode  Once       Question:  Patient needs a bedside commode to treat with the following condition  Answer:  Total knee replacement status   09/26/21 1449            Disposition: Home with home health PT     Follow-up Information     Madelyn Flavors, PA-C  Follow up on 10/10/2021.   Specialty: Orthopedic Surgery Why: at 9:45am Contact information: [address] [facility] [place] [address] [phone]         Donato Heinz, MD Follow up on 11/06/2021.   Specialty: Orthopedic Surgery Why: at 1:45pm Contact information: [address] [facility] [address] [facility] [place] [address] [phone]                  Lasandra Beech, PA-C 09/27/2021, 12:15 PM

## 2021-09-27 NOTE — Progress Notes (Signed)
Physical Therapy Treatment Patient Details Name: Maureen Cruz MRN: [id number] DOB: 27-Nov-1960 Today's Date: 09/27/2021   History of Present Illness Pt is 61 y.o. 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    PT Comments    Pt seen this am, husband present for session. No c/o nausea. Pt tolerated 80+ ft of gait training with RW (has one at home) with supervision. Good demonstration of technique and safety while negotiating 4 steps with single rail. 7/10 R knee pain upon completion. Pt feels comfortable with returning home today with initial HHPT transitioning to out-pt.    Recommendations for follow up therapy are one component of a multi-disciplinary discharge planning process, led by the attending physician.  Recommendations may be updated based on patient status, additional functional criteria and insurance authorization.  Follow Up Recommendations  Home health PT     Assistance Recommended at Discharge PRN  Patient can return home with the following A little help with walking and/or transfers;A little help with bathing/dressing/bathroom;Help with stairs or ramp for entrance   Equipment Recommendations  None recommended by PT    Recommendations for Other Services       Precautions / Restrictions Precautions Precautions: Fall Restrictions Weight Bearing Restrictions: Yes RLE Weight Bearing: Weight bearing as tolerated     Mobility  Bed Mobility Overal bed mobility: Needs Assistance Bed Mobility: Supine to Sit, Sit to Supine     Supine to sit: Modified independent (Device/Increase time) Sit to supine: Modified independent (Device/Increase time)        Transfers Overall transfer level: Needs assistance Equipment used: Rolling walker (2 wheels) Transfers: Sit to/from Stand Sit to Stand: Supervision           General transfer comment: No c/o nausea    Ambulation/Gait Ambulation/Gait assistance: Supervision Gait Distance (Feet): 85 Feet Assistive device: Rolling walker (2  wheels) Gait Pattern/deviations: Step-to pattern, Decreased stance time - right       General Gait Details: Pt progressed to step through towards end of monility   Stairs Stairs: Yes Stairs assistance: Supervision Stair Management: One rail Left, Forwards Number of Stairs: 4 General stair comments: Good demonstration of technique and safety awareness   Wheelchair Mobility    Modified Rankin (Stroke Patients Only)       Balance                                            Cognition Arousal/Alertness: Awake/alert Behavior During Therapy: WFL for tasks assessed/performed Overall Cognitive Status: Within Functional Limits for tasks assessed                                 General Comments: Husband present for session        Exercises Total Joint Exercises Ankle Circles/Pumps: AROM, Strengthening, Both, 10 reps, Supine Quad Sets: AROM, Strengthening, Both, 10 reps, Supine Gluteal Sets: AROM, Strengthening, Both, 10 reps, Supine Knee Flexion: AROM, Seated Goniometric ROM: 4-80 Other Exercises Other Exercises: pt educated on purpose of PT and benefits of therapy/exercises for improved outcomes s/p surgical intervention.    General Comments General comments (skin integrity, edema, etc.): Thigh high TEDs applied bilaterally      Pertinent Vitals/Pain Pain Assessment Pain Assessment: 0-10 Pain Score: 7  Pain Location: R Knee Pain Descriptors / Indicators: Aching Pain Intervention(s): Monitored during session,  Ice applied, Premedicated before session    Home Living Family/patient expects to be discharged to:: Private residence Living Arrangements: Spouse/significant other;Children Available Help at Discharge: Family;Available 24 hours/day Type of Home: House Home Access: Stairs to enter Entrance Stairs-Rails: Left Entrance Stairs-Number of Steps: 4-5 Alternate Level Stairs-Number of Steps: 15 Home Layout: Two level;Able to live on  main level with bedroom/bathroom Home Equipment: Rolling Walker (2 wheels);Cane - single point;BSC/3in1;Shower seat - built in;Grab bars - toilet;Grab bars - tub/shower;Hand held shower head;Wheelchair - Engineer, technical sales - power      Prior Function            PT Goals (current goals can now be found in the care plan section) Acute Rehab PT Goals Patient Stated Goal: to get stronger and go home. Progress towards PT goals: Progressing toward goals    Frequency    BID      PT Plan      Co-evaluation              AM-PAC PT "6 Clicks" Mobility   Outcome Measure  Help needed turning from your back to your side while in a flat bed without using bedrails?: A Little Help needed moving from lying on your back to sitting on the side of a flat bed without using bedrails?: A Little Help needed moving to and from a bed to a chair (including a wheelchair)?: A Little Help needed standing up from a chair using your arms (e.g., wheelchair or bedside chair)?: A Little Help needed to walk in hospital room?: A Little Help needed climbing 3-5 steps with a railing? : A Little 6 Click Score: 18    End of Session Equipment Utilized During Treatment: Gait belt Activity Tolerance: Patient tolerated treatment well Patient left: in chair;with call bell/phone within reach;with family/visitor present Nurse Communication: Mobility status PT Visit Diagnosis: Unsteadiness on feet (R26.81);Other abnormalities of gait and mobility (R26.89);Muscle weakness (generalized) (M62.81);Difficulty in walking, not elsewhere classified (R26.2);Pain Pain - Right/Left: Right Pain - part of body: Knee     Time: 1025-8527 PT Time Calculation (min) (ACUTE ONLY): 40 min  Charges:  $Gait Training: 8-22 mins $Therapeutic Exercise: 8-22 mins $Therapeutic Activity: 8-22 mins                    Maureen Cruz, PTA    Jannet Askew 09/27/2021, 11:04 AM

## 2021-09-28 LAB — BPAM RBC
Blood Product Expiration Date: 202308202359
Blood Product Expiration Date: 202308302359
Unit Type and Rh: 5100
Unit Type and Rh: 7300

## 2021-09-28 LAB — TYPE AND SCREEN
ABO/RH(D): B POS
Antibody Screen: POSITIVE
PT AG Type: NEGATIVE
Unit division: 0
Unit division: 0

## 2021-09-29 ENCOUNTER — Encounter: Payer: Self-pay | Admitting: Orthopedic Surgery

## 2022-07-31 ENCOUNTER — Other Ambulatory Visit: Payer: Self-pay

## 2022-07-31 DIAGNOSIS — Z1231 Encounter for screening mammogram for malignant neoplasm of breast: Secondary | ICD-10-CM

## 2022-08-13 ENCOUNTER — Ambulatory Visit
Admission: RE | Admit: 2022-08-13 | Discharge: 2022-08-13 | Disposition: A | Discharge status: Home / Self Care | Payer: BC Managed Care – PPO | Source: Ambulatory Visit | Attending: Infectious Diseases | Admitting: Infectious Diseases

## 2022-08-13 DIAGNOSIS — Z1231 Encounter for screening mammogram for malignant neoplasm of breast: Secondary | ICD-10-CM | POA: Insufficient documentation

## 2023-08-04 ENCOUNTER — Other Ambulatory Visit: Payer: Self-pay | Admitting: Infectious Diseases

## 2023-08-04 DIAGNOSIS — Z1231 Encounter for screening mammogram for malignant neoplasm of breast: Secondary | ICD-10-CM

## 2023-09-15 ENCOUNTER — Ambulatory Visit
Admission: RE | Admit: 2023-09-15 | Discharge: 2023-09-15 | Disposition: A | Discharge status: Home / Self Care | Source: Ambulatory Visit | Attending: Infectious Diseases | Admitting: Infectious Diseases

## 2023-09-15 DIAGNOSIS — Z1231 Encounter for screening mammogram for malignant neoplasm of breast: Secondary | ICD-10-CM | POA: Insufficient documentation

## 2024-01-07 IMAGING — US US BREAST*R* LIMITED INC AXILLA
1 series · 2 of 2 positions shown · non-contrast
Comparison: Previous exam(s).

CLINICAL DATA: 60-year-old female presenting for evaluation of a
palpable lump in the right breast for the past 3-4 weeks.

EXAM:
DIGITAL DIAGNOSTIC BILATERAL MAMMOGRAM WITH TOMOSYNTHESIS AND CAD;
ULTRASOUND RIGHT BREAST LIMITED
TECHNIQUE: Bilateral digital diagnostic mammography and breast tomosynthesis
was performed. The images were evaluated with computer-aided
detection.; Targeted ultrasound examination of the right breast was
performed

[Series 1: us breast*right* limited inc axilla · 0.07mm/px · 2 of 2 slices shown]
[im 1/2]
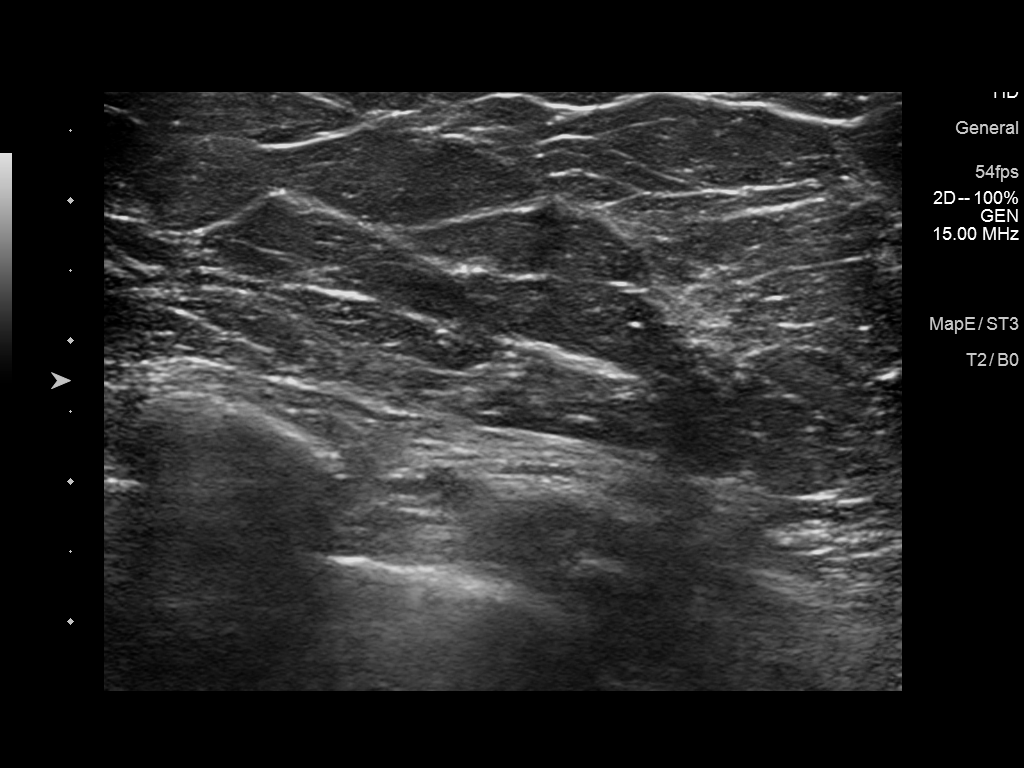
[im 2/2]
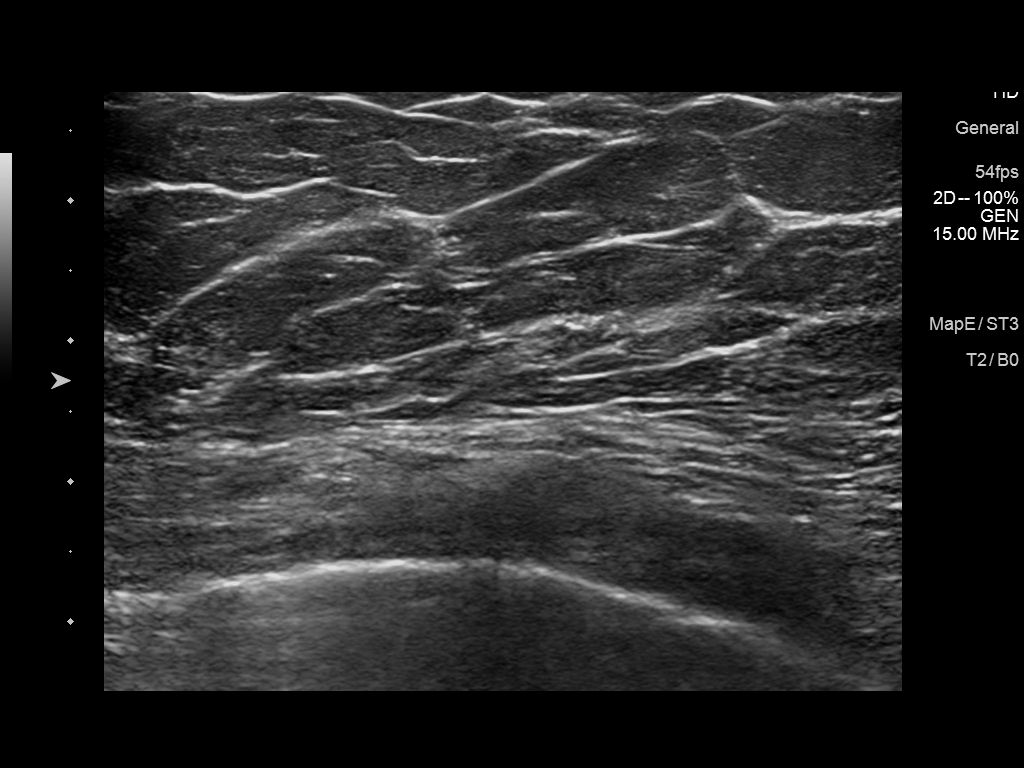

[2 of 2 positions shown; findings below may reference images not displayed]

ACR Breast Density Category b: There are scattered areas of
fibroglandular density.
FINDINGS: No suspicious calcifications, masses or areas of distortion are seen
in the bilateral breasts.

Ultrasound targeted to the inferior right breast demonstrates normal
fibroglandular tissue. No suspicious masses or areas of shadowing
are identified.
IMPRESSION: 1. There are no suspicious mammographic or targeted sonographic
abnormalities at the site of the palpable lump in the inferior right
breast.

2. No suspicious calcifications, masses or areas of distortion are
seen in the bilateral breasts.

RECOMMENDATION:
1. Clinical follow-up recommended for the palpable area of concern
in the right breast. Any further workup should be based on clinical
grounds.

2.  Screening mammogram in one year.(Code:IY-X-RL9)

I have discussed the findings and recommendations with the patient.
If applicable, a reminder letter will be sent to the patient
regarding the next appointment.

BI-RADS CATEGORY  1: Negative.
